# Patient Record
Sex: Female | Born: 1985 | Race: Black or African American | Hispanic: No | Marital: Married | Smoking: Never smoker
Health system: Southern US, Community
[De-identification: ages and names within clinical notes are randomized; demographics above are authoritative.]

## PROBLEM LIST (undated history)

## (undated) ENCOUNTER — Inpatient Hospital Stay (HOSPITAL_COMMUNITY): Payer: Self-pay

## (undated) DIAGNOSIS — I8390 Asymptomatic varicose veins of unspecified lower extremity: Secondary | ICD-10-CM

## (undated) DIAGNOSIS — Z789 Other specified health status: Secondary | ICD-10-CM

## (undated) DIAGNOSIS — I82409 Acute embolism and thrombosis of unspecified deep veins of unspecified lower extremity: Secondary | ICD-10-CM

## (undated) HISTORY — DX: Asymptomatic varicose veins of unspecified lower extremity: I83.90

## (undated) HISTORY — DX: Acute embolism and thrombosis of unspecified deep veins of unspecified lower extremity: I82.409

## (undated) HISTORY — PX: BREAST SURGERY: SHX581

## (undated) HISTORY — PX: OOPHORECTOMY: SHX86

---
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
## 2004-11-23 ENCOUNTER — Other Ambulatory Visit: Payer: Self-pay

## 2004-11-23 ENCOUNTER — Emergency Department: Payer: Self-pay | Admitting: Emergency Medicine

## 2005-11-24 ENCOUNTER — Ambulatory Visit: Payer: Self-pay | Admitting: Internal Medicine

## 2006-05-04 ENCOUNTER — Ambulatory Visit: Payer: Self-pay | Admitting: Family Medicine

## 2007-01-08 ENCOUNTER — Ambulatory Visit: Payer: Self-pay | Admitting: Family Medicine

## 2007-03-04 ENCOUNTER — Ambulatory Visit: Payer: Self-pay | Admitting: Family Medicine

## 2007-03-04 ENCOUNTER — Encounter (INDEPENDENT_AMBULATORY_CARE_PROVIDER_SITE_OTHER): Payer: Self-pay | Admitting: Internal Medicine

## 2007-03-04 LAB — CONVERTED CEMR LAB
Chlamydia, DNA Probe: NEGATIVE
GC Probe Amp, Genital: NEGATIVE

## 2007-04-28 ENCOUNTER — Encounter: Payer: Self-pay | Admitting: Family Medicine

## 2007-04-29 ENCOUNTER — Ambulatory Visit: Payer: Self-pay | Admitting: Family Medicine

## 2007-06-29 ENCOUNTER — Ambulatory Visit: Payer: Self-pay | Admitting: Family Medicine

## 2007-06-29 DIAGNOSIS — L709 Acne, unspecified: Secondary | ICD-10-CM | POA: Insufficient documentation

## 2007-06-29 DIAGNOSIS — L708 Other acne: Secondary | ICD-10-CM | POA: Insufficient documentation

## 2007-11-06 ENCOUNTER — Other Ambulatory Visit: Payer: Self-pay

## 2007-11-06 ENCOUNTER — Emergency Department: Payer: Self-pay | Admitting: Emergency Medicine

## 2007-11-28 ENCOUNTER — Emergency Department: Payer: Self-pay | Admitting: Emergency Medicine

## 2008-01-25 ENCOUNTER — Emergency Department: Payer: Self-pay | Admitting: Internal Medicine

## 2008-02-25 ENCOUNTER — Inpatient Hospital Stay (HOSPITAL_COMMUNITY): Admission: AD | Admit: 2008-02-25 | Discharge: 2008-02-25 | Payer: Self-pay | Admitting: Obstetrics and Gynecology

## 2008-03-19 ENCOUNTER — Inpatient Hospital Stay (HOSPITAL_COMMUNITY): Admission: AD | Admit: 2008-03-19 | Discharge: 2008-03-19 | Payer: Self-pay | Admitting: Obstetrics and Gynecology

## 2008-03-29 ENCOUNTER — Inpatient Hospital Stay (HOSPITAL_COMMUNITY): Admission: AD | Admit: 2008-03-29 | Discharge: 2008-03-29 | Payer: Self-pay | Admitting: Obstetrics and Gynecology

## 2008-04-05 ENCOUNTER — Inpatient Hospital Stay (HOSPITAL_COMMUNITY): Admission: AD | Admit: 2008-04-05 | Discharge: 2008-04-05 | Payer: Self-pay | Admitting: Obstetrics and Gynecology

## 2008-04-15 ENCOUNTER — Inpatient Hospital Stay (HOSPITAL_COMMUNITY): Admission: AD | Admit: 2008-04-15 | Discharge: 2008-04-15 | Payer: Self-pay | Admitting: Obstetrics and Gynecology

## 2008-04-24 IMAGING — US OB
1 series · 16 of 16 positions shown · non-contrast
Comparison: none

REASON FOR EXAM: VAG BLEEDING, RLQ PAIN
COMMENTS:

[Series 1: ob · 16 of 59 slices shown]
[im 1/59]
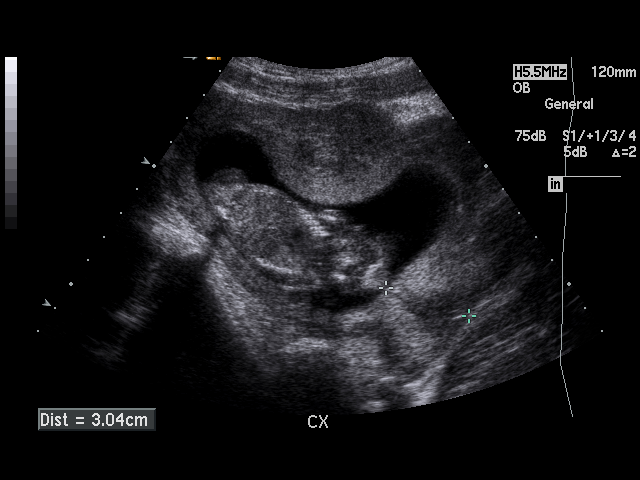
[im 4/59]
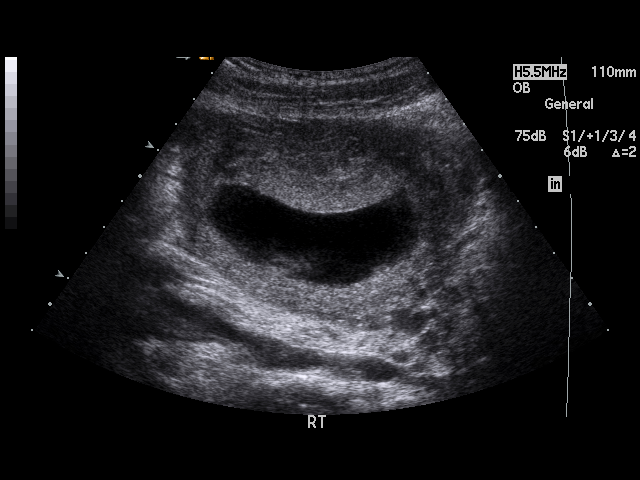
[im 8/59]
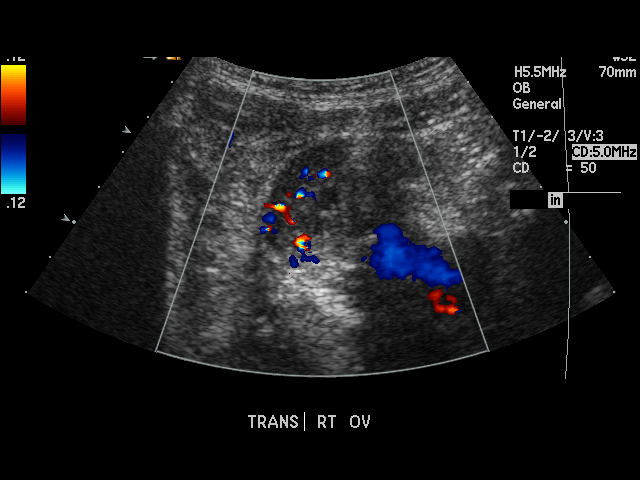
[im 12/59]
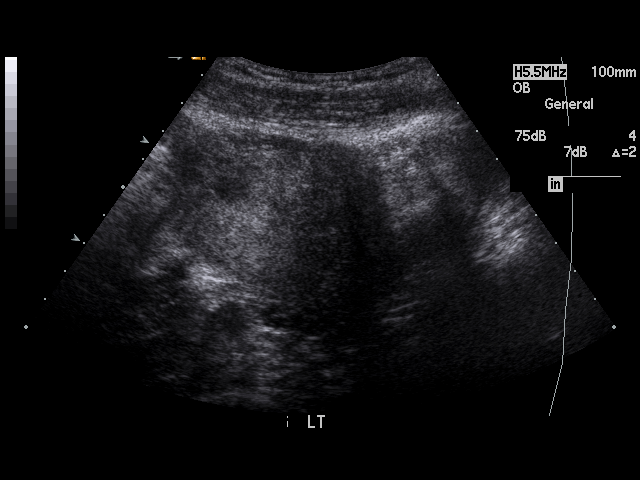
[im 16/59]
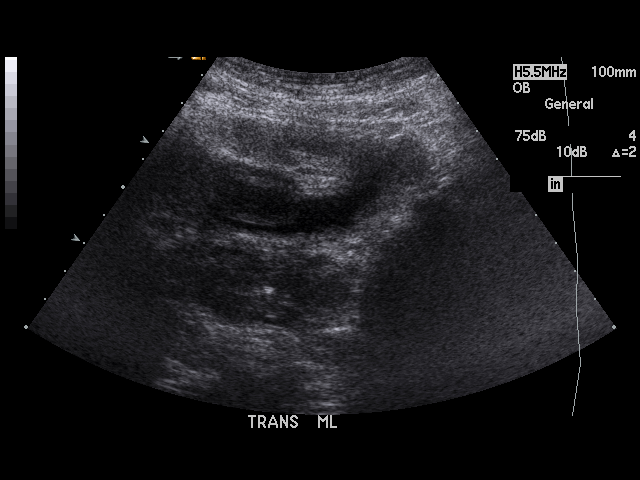
[im 20/59]
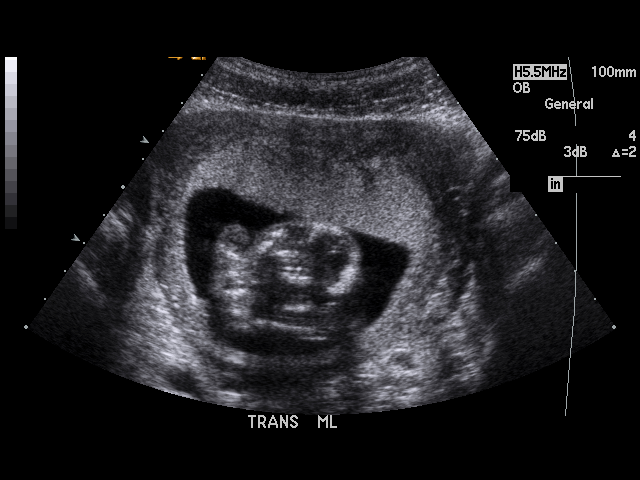
[im 24/59]
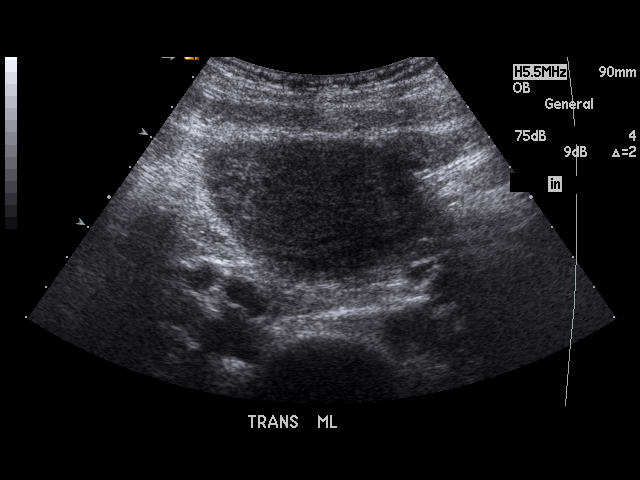
[im 28/59]
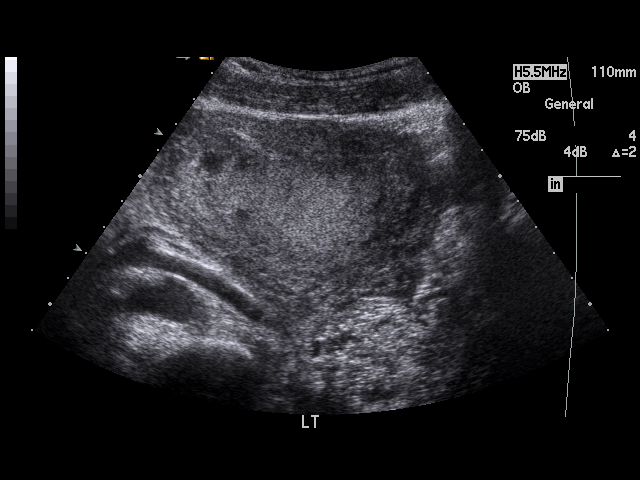
[im 31/59]
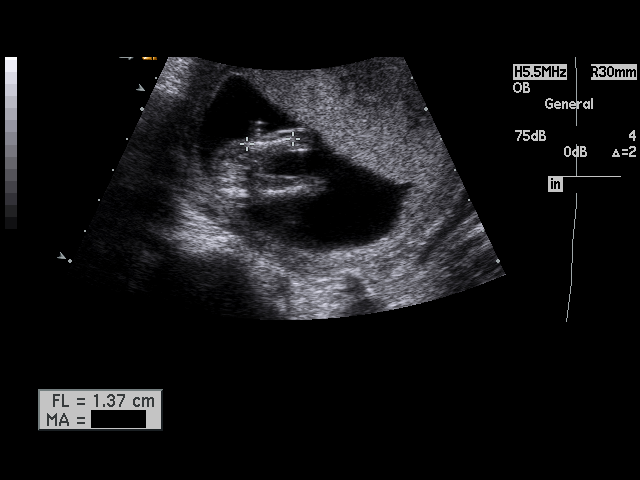
[im 35/59]
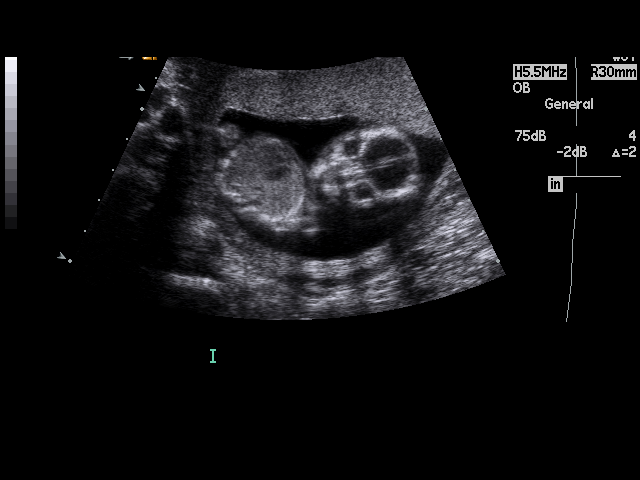
[im 39/59]
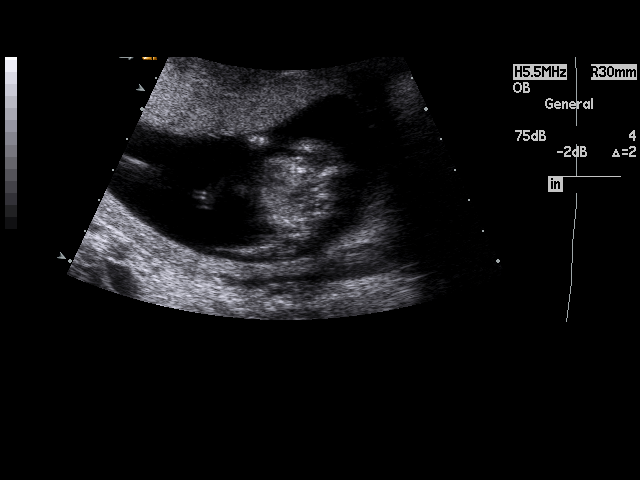
[im 43/59]
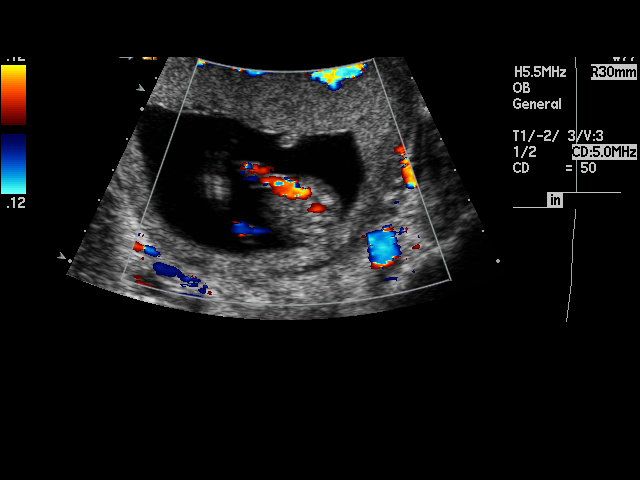
[im 47/59]
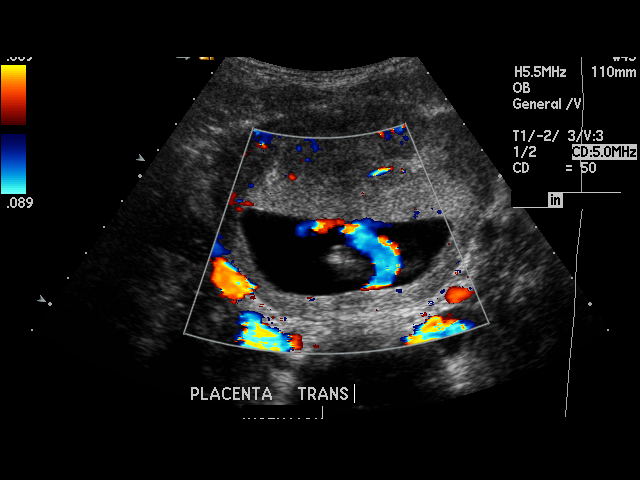
[im 51/59]
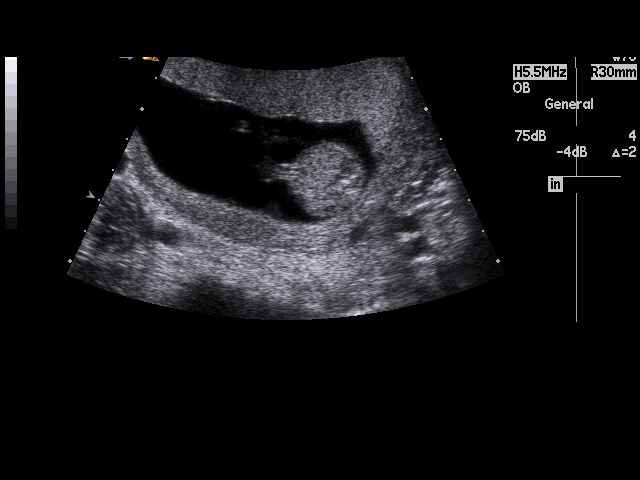
[im 55/59]
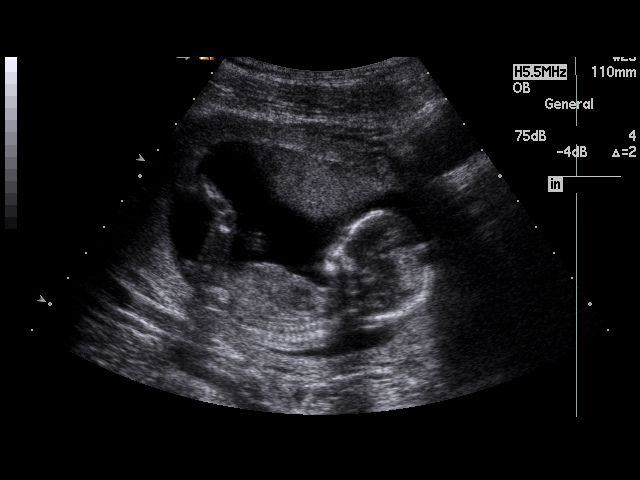
[im 59/59]
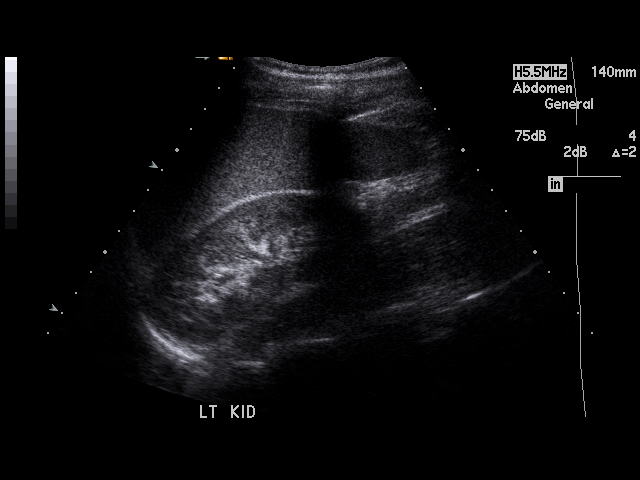

[16 of 16 positions shown; findings below may reference images not displayed]

PROCEDURE:     US  - US OB GREATER/OR EQUAL TO LONLS  - November 29, 2007  [DATE]

RESULT:     A single viable intrauterine pregnancy is appreciated with an
estimated gestational age of 14 weeks 5 days with an estimated date of
delivery 05/24/08.  Cardiac, trunk and extremity movement is appreciated.
Evaluation of the fetal anatomy demonstrates no stomach, diaphragm or
ventricular abnormalities.  Amniotic fluid is within normal limits.  The
placenta is Grade O, anterior and unremarkable.  Fetal heart rate is ranging
from 147 to 163 beats per minute.

Fetal biometry:

BPD   2.83 cm    Corresponding to an EGA of 15 weeks 0 days.
FL      1.47 cm     Corresponding to an EGA of 14 weeks 2 days

Estimated gestational age per LMP is 14 weeks 0 days.
IMPRESSION: Single viable intrauterine pregnancy.  The size appears to correlate with
estimated gestational age.

## 2008-05-17 ENCOUNTER — Inpatient Hospital Stay (HOSPITAL_COMMUNITY): Admission: AD | Admit: 2008-05-17 | Discharge: 2008-05-17 | Payer: Self-pay | Admitting: Obstetrics and Gynecology

## 2008-05-18 ENCOUNTER — Inpatient Hospital Stay (HOSPITAL_COMMUNITY): Admission: AD | Admit: 2008-05-18 | Discharge: 2008-05-20 | Payer: Self-pay | Admitting: Obstetrics and Gynecology

## 2008-05-25 ENCOUNTER — Inpatient Hospital Stay (HOSPITAL_COMMUNITY): Admission: AD | Admit: 2008-05-25 | Discharge: 2008-05-26 | Payer: Self-pay | Admitting: Obstetrics and Gynecology

## 2008-10-11 IMAGING — US US FETAL BPP W/O NONSTRESS
1 series · 9 of 9 positions shown · non-contrast
Comparison: none

OBSTETRICAL ULTRASOUND:
 This ultrasound exam was performed in the [HOSPITAL] Ultrasound Department.  The OB US report was generated in the AS system, and faxed to the ordering physician.  This report is also available in [REDACTED] PACS.

[Series 1: us fetal bpp w/o nonstress · non-contrast · 9 of 9 slices shown]
[im 1/9]
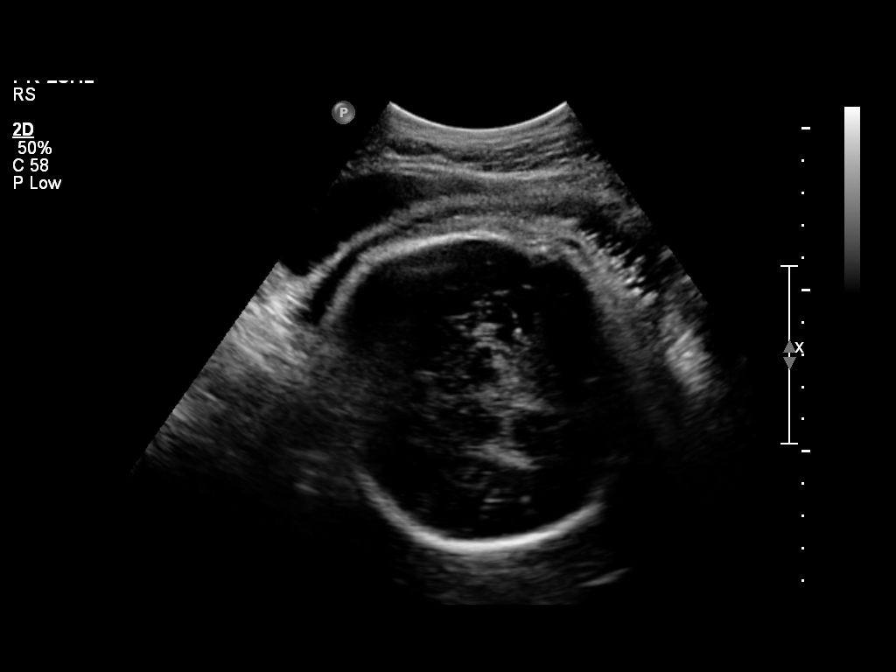
[im 2/9]
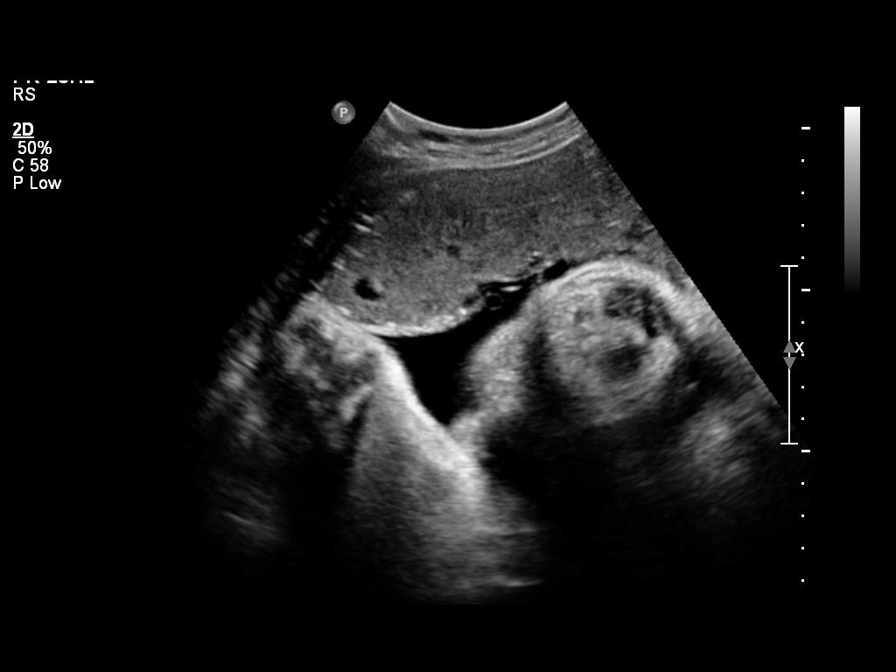
[im 3/9]
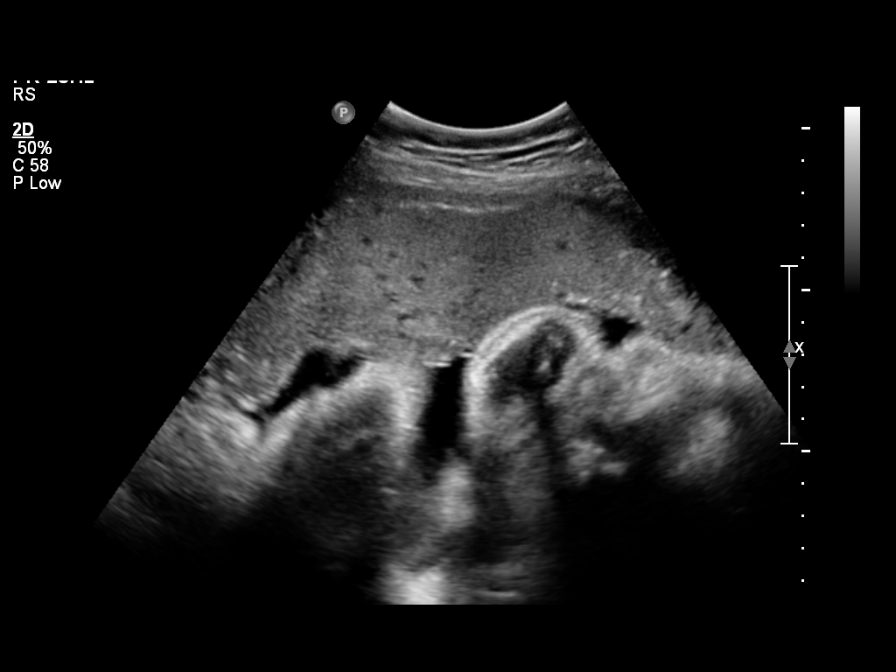
[im 4/9]
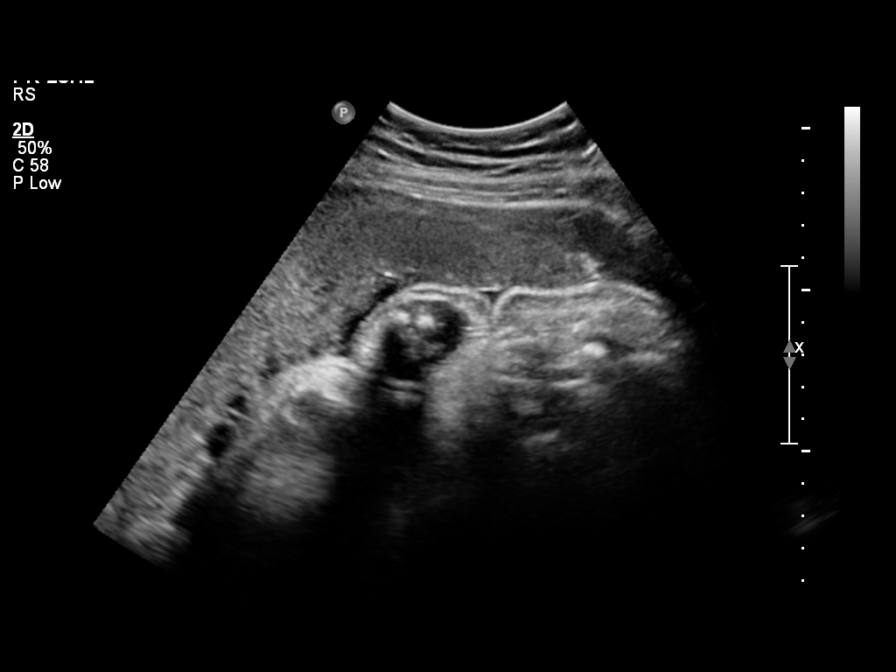
[im 5/9]
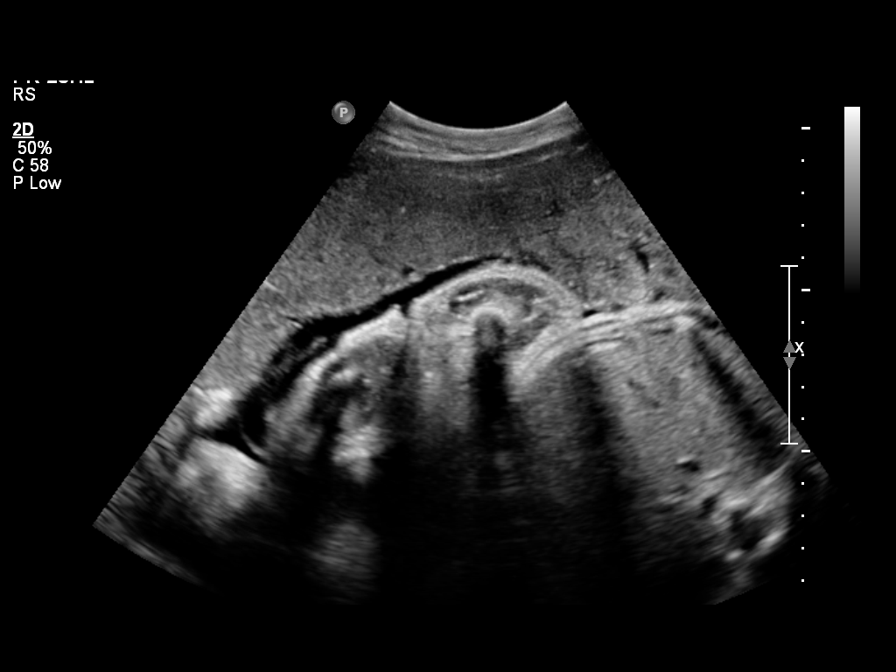
[im 6/9]
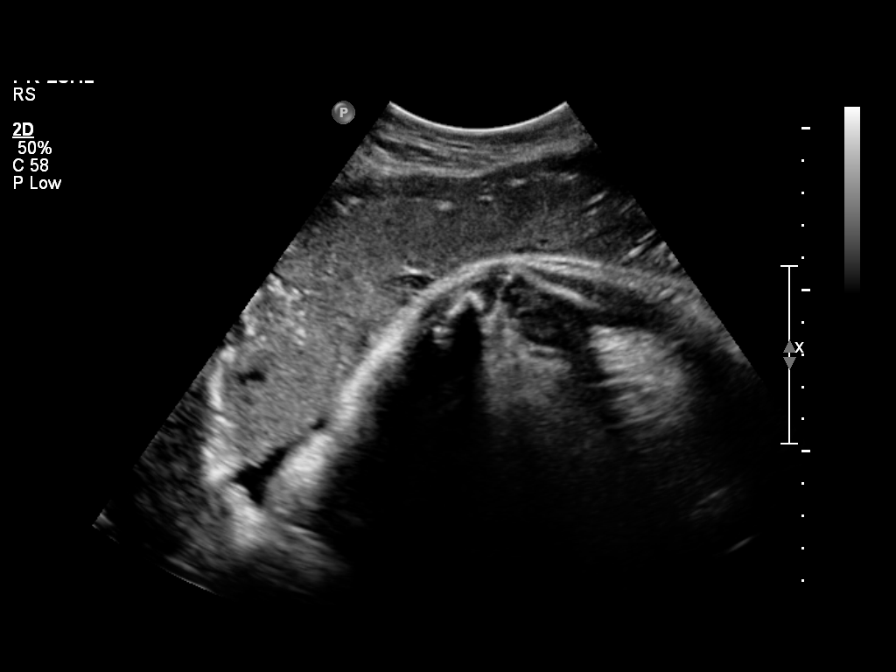
[im 7/9]
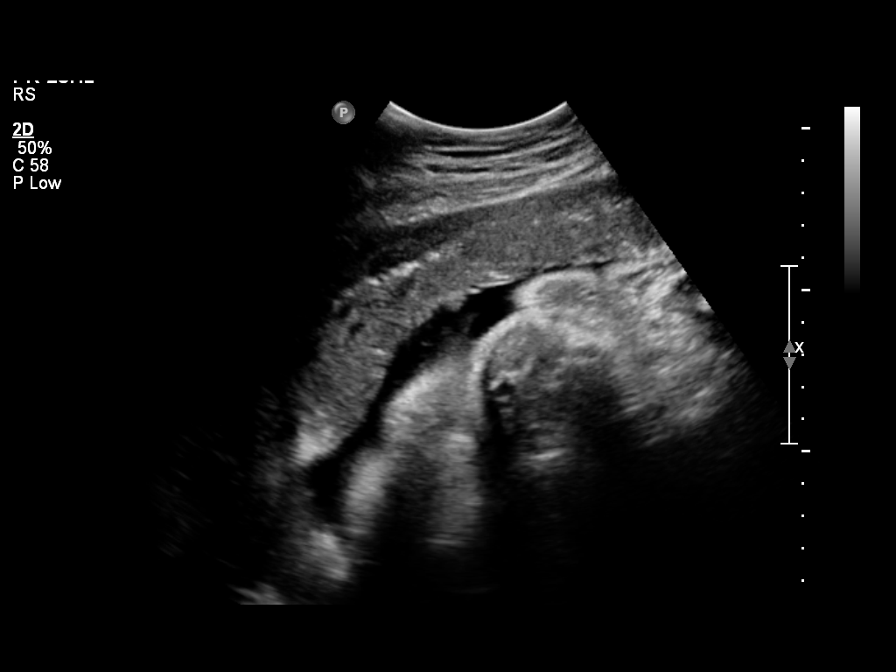
[im 8/9]
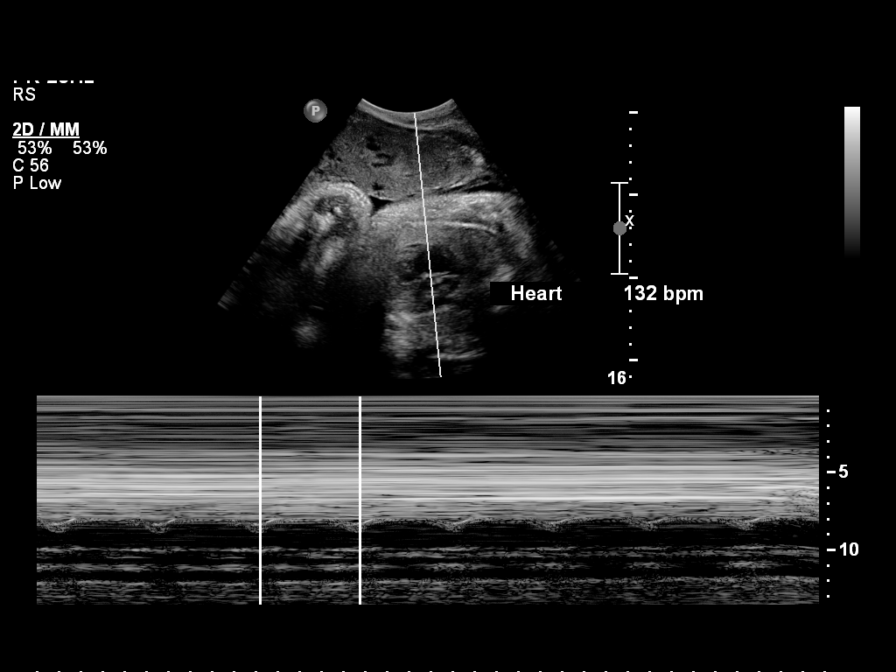
[im 9/9]
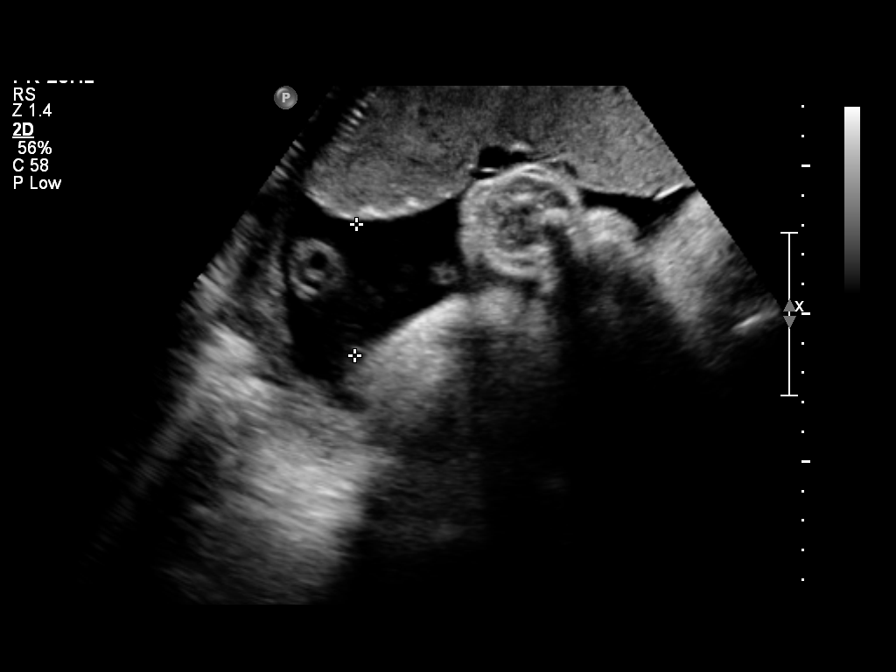

[9 of 9 positions shown; findings below may reference images not displayed]

IMPRESSION: See AS Obstetric US report.

## 2008-12-22 HISTORY — PX: OOPHORECTOMY: SHX86

## 2009-02-13 ENCOUNTER — Emergency Department (HOSPITAL_COMMUNITY): Admission: EM | Admit: 2009-02-13 | Discharge: 2009-02-13 | Payer: Self-pay | Admitting: Emergency Medicine

## 2009-04-03 ENCOUNTER — Inpatient Hospital Stay (HOSPITAL_COMMUNITY): Admission: AD | Admit: 2009-04-03 | Discharge: 2009-04-03 | Payer: Self-pay | Admitting: Family Medicine

## 2009-04-03 ENCOUNTER — Ambulatory Visit: Payer: Self-pay | Admitting: Family Medicine

## 2009-04-03 ENCOUNTER — Ambulatory Visit: Payer: Self-pay | Admitting: Cardiology

## 2009-04-03 ENCOUNTER — Ambulatory Visit: Payer: Self-pay | Admitting: Advanced Practice Midwife

## 2009-04-03 DIAGNOSIS — D369 Benign neoplasm, unspecified site: Secondary | ICD-10-CM | POA: Insufficient documentation

## 2009-04-03 LAB — CONVERTED CEMR LAB
AST: 17 units/L (ref 0–37)
Albumin: 3.8 g/dL (ref 3.5–5.2)
Alkaline Phosphatase: 57 units/L (ref 39–117)
Beta hcg, urine, semiquantitative: NEGATIVE
Blood in Urine, dipstick: NEGATIVE
CO2: 28 meq/L (ref 19–32)
Chloride: 109 meq/L (ref 96–112)
Eosinophils Relative: 0.2 % (ref 0.0–5.0)
Glucose, Bld: 90 mg/dL (ref 70–99)
Monocytes Relative: 5.4 % (ref 3.0–12.0)
Neutrophils Relative %: 83.9 % — ABNORMAL HIGH (ref 43.0–77.0)
Platelets: 216 10*3/uL (ref 150.0–400.0)
Potassium: 3.6 meq/L (ref 3.5–5.1)
Sodium: 141 meq/L (ref 135–145)
Specific Gravity, Urine: >=1.03
Total Protein: 7.4 g/dL (ref 6.0–8.3)
Urobilinogen, UA: 0.2
WBC: 5.6 10*3/uL (ref 4.5–10.5)

## 2009-04-04 ENCOUNTER — Telehealth: Payer: Self-pay | Admitting: Family Medicine

## 2009-04-10 ENCOUNTER — Encounter (INDEPENDENT_AMBULATORY_CARE_PROVIDER_SITE_OTHER): Payer: Self-pay | Admitting: Obstetrics and Gynecology

## 2009-04-11 ENCOUNTER — Inpatient Hospital Stay (HOSPITAL_COMMUNITY): Admission: RE | Admit: 2009-04-11 | Discharge: 2009-04-12 | Payer: Self-pay | Admitting: Obstetrics and Gynecology

## 2009-11-01 ENCOUNTER — Ambulatory Visit: Payer: Self-pay | Admitting: Family Medicine

## 2010-02-11 ENCOUNTER — Emergency Department: Payer: Self-pay | Admitting: Emergency Medicine

## 2010-02-13 ENCOUNTER — Telehealth: Payer: Self-pay | Admitting: Family Medicine

## 2010-03-20 ENCOUNTER — Ambulatory Visit: Payer: Self-pay | Admitting: Family Medicine

## 2010-03-20 DIAGNOSIS — R5383 Other fatigue: Secondary | ICD-10-CM

## 2010-03-20 DIAGNOSIS — R5381 Other malaise: Secondary | ICD-10-CM | POA: Insufficient documentation

## 2010-03-20 DIAGNOSIS — N979 Female infertility, unspecified: Secondary | ICD-10-CM | POA: Insufficient documentation

## 2010-03-21 ENCOUNTER — Encounter: Payer: Self-pay | Admitting: Family Medicine

## 2010-03-26 ENCOUNTER — Encounter: Payer: Self-pay | Admitting: Family Medicine

## 2010-03-26 LAB — CONVERTED CEMR LAB
AST: 19 units/L (ref 0–37)
BUN: 11 mg/dL (ref 6–23)
Basophils Absolute: 0 10*3/uL (ref 0.0–0.1)
Eosinophils Absolute: 0 10*3/uL (ref 0.0–0.7)
GFR calc non Af Amer: 98.58 mL/min (ref >60)
Glucose, Bld: 82 mg/dL (ref 70–99)
HCT: 39.2 % (ref 36.0–46.0)
Lymphs Abs: 2.3 10*3/uL (ref 0.7–4.0)
Monocytes Absolute: 0.3 10*3/uL (ref 0.1–1.0)
Monocytes Relative: 7.2 % (ref 3.0–12.0)
Platelets: 290 10*3/uL (ref 150.0–400.0)
Potassium: 4.2 meq/L (ref 3.5–5.1)
RDW: 17.7 % — ABNORMAL HIGH (ref 11.5–14.6)
TSH: 1.17 microintl units/mL (ref 0.35–5.50)
Total Bilirubin: 1.8 mg/dL — ABNORMAL HIGH (ref 0.3–1.2)
Vit D, 25-Hydroxy: 19 ng/mL — ABNORMAL LOW (ref 30–89)

## 2010-09-18 ENCOUNTER — Ambulatory Visit: Payer: Self-pay | Admitting: Internal Medicine

## 2010-09-18 DIAGNOSIS — J029 Acute pharyngitis, unspecified: Secondary | ICD-10-CM | POA: Insufficient documentation

## 2010-09-18 LAB — CONVERTED CEMR LAB: Rapid Strep: NEGATIVE

## 2011-01-21 NOTE — Assessment & Plan Note (Signed)
Summary: ??strep throat/alc   Vital Signs:  Patient profile:   25 year old female Weight:      135.25 pounds Temp:     97.8 degrees F oral Pulse rate:   78 / minute Pulse rhythm:   regular BP sitting:   116 / 82  (left arm) Cuff size:   regular  Vitals Entered By: Selena Batten Dance CMA Duncan Dull) (September 18, 2010 10:31 AM) CC: ST/laryngitis   History of Present Illness: CC: ST/lost voice  1 d h/o sore throat. This am with voice gone, hurts to talk and swallow.  No fevers/chills,  but did wake up diaphoretic last night.  No coughing.  No HA, sinus congestion or RN/sneezing.  Tried equate cold/flu gelcaps, not much improvement.  No abd pain, n/v/d.  No sick contacts at home.  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  + strep exposure  No asthma, allergies.  No smokers at home.  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   Eyes:  vision grossly intact, pupils equal, pupils round, and pupils reactive to light.  no conjunctival pallor, injection or icterus  Ears:  R ear normal and L ear normal.   Nose:  no nasal discharge.   Mouth:  erythematous pharynx, 1+ tonsils bilaterally, no exudates Neck:  + small AC LAD R>L, nontender  Lungs:  Normal respiratory effort, chest expands symmetrically. Lungs are  clear to auscultation, no crackles or wheezes. Heart:  Normal rate and regular rhythm. S1 and S2 normal without gallop, murmur, click, rub or other extra sounds. Pulses:  2+ rad pulses Extremities:  no edema Skin:  Intact without suspicious lesions or rashes   Impression & Recommendations:  Problem # 1:  SORE THROAT (ICD-462) rapid strep today given exposure.  negative.  viral URTI, discussed supportive treatment.  RTC if not improving as expected.   Orders: Rapid Strep (16109)  Patient Instructions: 1)  Rapid strep negative.  You have a viral infection.   2)  Wash hands to prevent spreading. 3)  Push fluids, plenty of rest, ibuprofen (motrin) for sore throat up to 600mg  every 6-8 hours.  Suck on cold things like popsicles, or try warm herbal teas for relief.  consider salt water gargles. 4)  If fever >101.5, trouble swallowing or breathing or opening mouth, drooling, or other concerns, you may need to return to be seen. 5)  Pleasure to see you today, call clinic with questions.   Prior Medications: Current Allergies (reviewed today): No known allergies   Laboratory Results  Date/Time Received: September 18, 2010 10:55 AM  Date/Time Reported: September 18, 2010 10:55 AM   Other Tests  Rapid Strep: negative

## 2011-01-21 NOTE — Assessment & Plan Note (Signed)
Summary: PERSONAL/CLE   Vital Signs:  Patient profile:   25 year old female Height:      66 inches Weight:      134.75 pounds BMI:     21.83 Temp:     98 degrees F oral Pulse rate:   60 / minute Pulse rhythm:   regular BP sitting:   108 / 70  (left arm) Cuff size:   regular  Vitals Entered By: Lewanda Rife LPN (March 20, 2010 4:21 PM) CC: Pt has been trying for approx 1 1/2 years to get pregnant and not pregnant yet, also pt feels tired and wants iron and Vit D level checked   History of Present Illness: has been trying to have a baby 11/2 years- has been trying for a while no birth control has tried basal body temp and ovulation kits - indicated she is ovulating every month    had a cyst on her R ovary and that was removed -- april of last year  no other gyn problems  last visit about a year ago with Dr Marcelle Overlie   is tired in general  some headaches this week  usual work and eating schedule  getting enough sleep -- 7-9 hours per night  work hours are not too bad   periods are very regular -- 28 days   not a lot of stress overall - except this infertility issue  is more cold natured than she used to be  some heavy menses- ? 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 Eyes:  Denies blurring and eye irritation. ENT:  Denies sore throat. CV:  Denies chest pain or discomfort and palpitations. Resp:  Denies cough, shortness of breath, and wheezing. GI:  Denies abdominal pain, bloody stools, change in bowel habits, indigestion, loss of appetite, and nausea. GU:  Denies abnormal vaginal bleeding, discharge, dysuria, hematuria, and urinary frequency. MS:  Denies joint pain and stiffness. Derm:  Denies poor wound healing and rash. Neuro:  Denies headaches, numbness, tingling, and weakness. Psych:  Denies anxiety and depression. Endo:  Denies cold intolerance, excessive thirst, excessive urination, heat intolerance, and weight change. Heme:  Denies abnormal bruising and bleeding.  Physical Exam  General:  Well-developed,well-nourished,in no acute distress; alert,appropriate and cooperative throughout examination Head:  normocephalic, atraumatic, and no abnormalities observed.   Eyes:  vision grossly intact, pupils equal, pupils round, and pupils reactive to light.  no conjunctival pallor, injection or icterus  Ears:  R ear normal and L ear normal.  Nose:  no nasal discharge.   Mouth:  pharynx pink and moist.   Neck:  supple with full rom and no masses or thyromegally, no JVD or carotid bruit  Chest Wall:  No deformities, masses, or tenderness noted. Lungs:  Normal respiratory effort, chest expands symmetrically. Lungs are clear to auscultation, no crackles or wheezes. Heart:  Normal rate and regular rhythm. S1 and S2 normal without gallop, murmur, click, rub or other extra sounds. Abdomen:  Bowel sounds positive,abdomen soft and non-tender without masses, organomegaly or hernias noted. no suprapubic tenderness or  fullness felt  Msk:  No deformity or scoliosis noted of thoracic or lumbar spine.  no acute joint changes  Pulses:  R and L carotid,radial,femoral,dorsalis pedis and posterior tibial pulses are full and equal bilaterally Extremities:  No clubbing, cyanosis, edema, or deformity noted with normal full range of motion of all joints.   Neurologic:  cranial nerves II-XII intact, sensation intact to light touch, gait normal, DTRs symmetrical and normal, and toes down bilaterally on Babinski.   Skin:  Intact without suspicious lesions or rashes no pallor or icterus  Cervical Nodes:  No lymphadenopathy noted Inguinal Nodes:  No significant adenopathy Psych:  normal affect, talkative and pleasant    Impression & Recommendations:  Problem # 1:  FATIGUE (ICD-780.79) Assessment New labs today incl vit D level at her request  some heavy menses also trouble getting pregnant labs today Orders: Venipuncture (16109) TLB-BMP (Basic Metabolic Panel-BMET) (80048-METABOL) TLB-CBC Platelet - w/Differential (85025-CBCD) TLB-Hepatic/Liver Function Pnl (80076-HEPATIC) TLB-TSH (Thyroid Stimulating Hormone) (84443-TSH) T-Vitamin D (25-Hydroxy) (60454-09811) Specimen Handling (91478)  Problem # 2:  FEMALE INFERTILITY (ICD-628.9) Assessment: New with no OC for 11/2 years- pt is getting worried  has had one ovary removed for cyst  per pt ovulation kits indicate she is regularly ovulating and menses are regular lab today incl thyroid ref to GYN for this and year ly exam  Orders: Venipuncture (29562) TLB-BMP (Basic Metabolic Panel-BMET) (80048-METABOL) TLB-CBC Platelet - w/Differential (85025-CBCD) TLB-Hepatic/Liver Function Pnl (80076-HEPATIC) TLB-TSH (Thyroid Stimulating Hormone) (13086-VHQ) Gynecologic Referral (Gyn)  Complete Medication List: 1)  Alka-seltzer Plus Cold 2-7.8-325 Mg Tbef (Chlorphen-phenyleph-asa) .... Otc as directed. 2)  Prenatal Vitamins 0.8 Mg Tabs (Prenatal multivit-min-fe-fa)  .Marland Kitchen.. 1 by mouth once daily  Patient Instructions: 1)  we will ref you to obgyn at check out  2)  labs today 3)  eat healthy diet  4)  start on your prenatal vitamins - I sent px to [facility]  Prescriptions: PRENATAL VITAMINS 0.8 MG TABS (PRENATAL MULTIVIT-MIN-FE-FA) 1 by mouth once daily  #90 x 3   Entered and Authorized by:   Judith Part MD   Signed by:   Judith Part MD on 03/20/2010   Method used:   Electronically to        [facility] [address] (retail)       [address], [address]       [place], [place]  [address]       Ph: [phone]       Fax: [phone]   RxID:   [id number]   Current Allergies (reviewed today): No known allergies

## 2011-01-21 NOTE — Miscellaneous (Signed)
Summary: vitamin D 50000iu rx  Medications Added VITAMIN D (ERGOCALCIFEROL) 50000 UNIT CAPS (ERGOCALCIFEROL) take one capsule by mouth every week for 10 weeks.       Clinical Lists Changes  Medications: Added new medication of VITAMIN D (ERGOCALCIFEROL) 50000 UNIT CAPS (ERGOCALCIFEROL) take one capsule by mouth every week for 10 weeks. - Signed Rx of VITAMIN D (ERGOCALCIFEROL) 50000 UNIT CAPS (ERGOCALCIFEROL) take one capsule by mouth every week for 10 weeks.;  #10 x 0;  Signed;  Entered by: Lewanda Rife LPN;  Authorized by: Judith Part MD;  Method used: Electronically to [facility] [address], [place], [place], [place]  [address], Ph: [phone], Fax: [phone]    Prescriptions: VITAMIN D (ERGOCALCIFEROL) 50000 UNIT CAPS (ERGOCALCIFEROL) take one capsule by mouth every week for 10 weeks.  #10 x 0   Entered by:   Lewanda Rife LPN   Authorized by:   Judith Part MD   Signed by:   Lewanda Rife LPN on 13/24/4010   Method used:   Electronically to        [facility] [address] (retail)       [address], [address]       [place], [place]  [address]       Ph: [phone]       Fax: [phone]   RxID:   [id number]   Current Allergies: No known allergies

## 2011-01-21 NOTE — Progress Notes (Signed)
Summary: Sick  Phone Note Call from Patient Call back at [facility] Phone [phone]   Caller: Patient Call For: Dr. Patsy Lager Summary of Call: Dr. Ermalene Searing patient: pt states she was vomiting and went to [facility] on Sunday, then the vomititng started back today, pt states [facility] gave her an IV and something for nausea in her IV. Pt would like to be seen, please advise.  *** we have no triage nurse today, all nurses will be talking calls. Initial call taken by: Mervin Hack CMA Duncan Dull),  February 13, 2010 8:13 AM  Follow-up for Phone Call        ok to put on my schedule Follow-up by: Hannah Beat MD,  February 13, 2010 8:17 AM  Additional Follow-up for Phone Call Additional follow up Details #1::        Spoke with patient and advised results, coming at 10 Additional Follow-up by: Mervin Hack CMA Duncan Dull),  February 13, 2010 8:24 AM

## 2011-01-21 NOTE — Progress Notes (Signed)
Summary: call a nurse   Phone Note Call from Patient Call back at Home Phone [phone]   Caller: Patient Call For: Kerby Nora MD Summary of Call: Triage Record Num: [id number] Operator: Aundra Millet Patient Name: Lauren Solis Call Date & Time: 02/12/2010 7:03:39PM Patient Phone: [phone] PCP: Idamae Schuller A. Tower Patient Gender: Female PCP Fax : Patient DOB: 12/16/1986 Practice Name: [facility] Reason for Call: LMP 01/22/2010- Onset of N/V with 5-10 episodes on 02/10/2010 and seen at [facility] ED. Was given nausea med and discharged. Also given rx for UTI, but hasnt strated yet - but wasnt having UTI sx's nor stlll is. No vomiting 02/11/2010, but returned 02/12/2010 with 3 vomting episodes since 1500 along with 2 diarrhea stools. Last vomiting episode at  ~ 1730 No fever. Has been drinking water and ginger ale and crackers at times. Worsening abd pain all day that isnt relieved with vomiting . Cant get comfortable. RN advised to go [facility] for evaluation -- Pt stated she will try to get transportation Protocol(s) Used: Abdominal Pain / Discomfort Protocol(s) Used: Nausea / Vomiting Recommended Outcome per Protocol: See ED Immediately Reason for Outcome: Abdominal pain is more bothersome than vomiting Abdominal pain that has steadily worsened over hours OR has been continuous for 3 hours or more AND any of the following: loss of appetite, vomiting starting after pain, any fever, OR unable to carry out normal activities Care Advice:  ~ Another adult should drive.  ~ Pain medication or laxatives should not be taken until symptoms are evaluated.  ~ Do not eat or drink anything until evaluated by provider.  ~ IMMEDIATE ACTION 02/12/2010 7:21:10PM Page 1 of 1 [id number] Initial call taken by: Melody Comas,  February 13, 2010 9:48 AM  Follow-up for Phone Call        appt today Follow-up by: Hannah Beat MD,  February 13, 2010 9:53 AM

## 2011-04-02 LAB — CBC
MCHC: 34.4 g/dL (ref 30.0–36.0)
Platelets: 235 10*3/uL (ref 150–400)
Platelets: 281 10*3/uL (ref 150–400)
RDW: 17 % — ABNORMAL HIGH (ref 11.5–15.5)
WBC: 3.5 10*3/uL — ABNORMAL LOW (ref 4.0–10.5)

## 2011-04-02 LAB — TYPE AND SCREEN
ABO/RH(D): A POS
Antibody Screen: NEGATIVE

## 2011-04-02 LAB — ABO/RH: ABO/RH(D): A POS

## 2011-04-02 LAB — PREGNANCY, URINE: Preg Test, Ur: NEGATIVE

## 2011-05-06 NOTE — Discharge Summary (Signed)
NAMEAMANDAJO, GONDER            ACCOUNT NO.:  [id number]   MEDICAL RECORD NO.:  [id number]          PATIENT TYPE:  INP   LOCATION:  9302                          FACILITY:  [facility]   PHYSICIAN:  Duke Salvia. Marcelle Overlie, M.D.DATE OF BIRTH:  1986-01-28   DATE OF ADMISSION:  04/10/2009  DATE OF DISCHARGE:  04/12/2009                               DISCHARGE SUMMARY   DISCHARGE DIAGNOSES:  1. Left dermoid cyst.  2. Pelvic pain.  3. Laparoscopy with laparotomy.  4. Left oophorectomy, this admission.  5. Right ovarian cystotomy.   SUMMARY OF HISTORY AND PHYSICAL EXAM:  Please see admission H and P for  details.  Briefly, a 25 year old with left lower quadrant pain.  CT  parameter showed to have a 6-cm dermoid cyst.  On April 10, 2009, under  general anesthesia, she underwent laparoscopy followed by mini  laparotomy with left oophorectomy.  The left ovary was completely  replaced by the dermoid cyst which was excised.  She had a right ovarian  cystotomy for a clear-walled cyst.  On the first postop day, hemoglobin  was 10.5, her diet was advanced, catheter was removed, she was ambulated  without difficulty.  On the second postop day, on April 12, 2009, she  was afebrile, incision was clean and dry, she was ready for discharge at  that point.   LABORATORY DATA:  Blood type is A positive.  Antibody screen negative.  UPT negative.  CBC on admission:  WBC 3.5, hemoglobin 12.9, hematocrit  38.2.  On April 11, 2009, CBC:  WBC 6.5, hemoglobin 10.5, hematocrit  30.6.   DISPOSITION:  The patient is discharged on Tylox p.r.n. pain.  Will  return to the office in 7-10 days.  Advised to report any incisional  redness or drainage, increased pain or bleeding, or fever over 101.  She  was given specific instructions regarding diet/exercise.   CONDITION:  Good.   ACTIVITY:  Good, increase as tolerated.      Richard M. Marcelle Overlie, M.D.  Electronically Signed     RM[facility]/MEDQ  D:  04/12/2009  T:   04/13/2009  Job:  [id number]

## 2011-05-06 NOTE — Op Note (Signed)
NAMEFRITZI, SCRIPTER            ACCOUNT NO.:  [id number]   MEDICAL RECORD NO.:  [id number]          PATIENT TYPE:  OIB   LOCATION:  9302                          [facility]ACILITY:  [facility]   PHYSICIAN:  Duke Salvia. Marcelle Overlie, M.D.DATE OF BIRTH:  [birth year]   DATE OF PROCEDURE:  DATE OF DISCHARGE:                               OPERATIVE REPORT   PREOPERATIVE DIAGNOSIS:  Pelvic pain, probable left ovarian dermoid  cyst.   POSTOPERATIVE DIAGNOSIS:  Pelvic pain, probable left ovarian dermoid  cyst.   PROCEDURE:  Diagnostic laparoscopy followed by minilaparotomy, left  oophorectomy, right ovarian cystotomy.   SURGEON:  Duke Salvia. Marcelle Overlie, MD   ANESTHESIA:  General endotracheal.   COMPLICATIONS:  None.   DRAINS:  In-and-out Foley catheter.   BLOOD LOSS:  Minimal.   SPECIMENS REMOVED:  Left ovary.   PROCEDURE AND FINDINGS:  The patient was taken to the operating room.  After an adequate level of general endotracheal anesthesia was obtained  with the patient's legs in stirrups, the abdomen, perineum, and vagina  prepped and draped in usual manner for laparoscopy.  Bladder was  drained, EUA carried out, uterus midposition, mobile adnexa, soft mass  on the left, difficult to delineate.  Hulka tenaculum was positioned.  Attention directed to the abdomen.  Subumbilical area was infiltrated  with 0.25% Marcaine plain.  A small incision was made.  The Veress  needle was introduced without difficulty.  Intra-abdominal position  verified by pressure and water testing.  A 2.5 L pneumoperitoneum was  then created.  Laparoscopic trocar and sleeve were then introduced  without difficulty.  Three fingerbreadths above the symphysis in the  midline a 5-mm trocar was inserted under direct visualization.  The  patient was then placed in Trendelenburg and pelvic findings as follows.   The left ovary was completely enlarged with a 6-cm smooth-walled cyst.  There was no ascites.  No excrescences were noted.   The ovary was  mobile.  There was a small centimeter and a half cyst at one pole of the  right ovary which was otherwise unremarkable.  I observed the ovary  carefully, I could not find any functional normal-appearing ovary that  remained on that side.  Due to concerns about spillage of the dermoid,  decision made to proceed with minilaparotomy.  The gas was allowed to  escape.  The umbilical incision was closed with a 4-0 Vicryl Rapide  suture subcuticular.  A small Pfannenstiel incision was made, carried  down to the fascia which was incised and extended transversely.  Rectus  muscle was divided in the midline.  Peritoneum was entered without  difficulty.  At this point, the right ovary could be teased up to the  incision since she was extremely thin.  On careful observation, again I  could not delineate any functional-appearing normal remaining ovary on  that side.  Decision made to proceed with left oophorectomy.  Two Kelly  clamps were then placed across the base of the ovary which was then  excised.  These were suture ligated in Heaney fashion with 0 Vicryl  suture.   Specimen was removed  to the side table and photographed, was incised and  had characteristics of a dermoid with oily material and hair, no teeth  noted.  This was sent to Pathology.  The operative site was hemostatic.  On the right ovary, the ovarian cystotomy was performed revealing clear  fluid only.  I did not demonstrate a residual teratoma on her right  side.  Prior to closure, sponge, needle, and instruments counts reported  as correct x2.  Peritoneum closed with a 2-0 chromic suture as were the  rectus muscles reapproximated with the same.  Fascia closed transversely  with 0 PDS suture.  The incision was infiltrated with Marcaine on the  way out.  A 4-0 Monocryl subcuticular suture was positioned, and the  Hulka tenaculum was removed.  She tolerated this well, went to recovery  room in good condition.       Richard M. Marcelle Overlie, M.D.  Electronically Signed     RM[facility]/MEDQ  D:  04/10/2009  T:  04/10/2009  Job:  [id number]

## 2011-06-02 ENCOUNTER — Emergency Department: Payer: Self-pay | Admitting: Unknown Physician Specialty

## 2011-09-15 LAB — COMPREHENSIVE METABOLIC PANEL
ALT: 17
AST: 17
Albumin: 2.8 — ABNORMAL LOW
CO2: 27
Calcium: 8.4
Chloride: 102
GFR calc Af Amer: >60
GFR calc non Af Amer: >60
Sodium: 135

## 2011-09-15 LAB — URINALYSIS, ROUTINE W REFLEX MICROSCOPIC
Nitrite: NEGATIVE
Protein, ur: NEGATIVE
Specific Gravity, Urine: 1.025
Urobilinogen, UA: 0.2

## 2011-09-15 LAB — CBC
MCHC: 34.3
Platelets: 228
RBC: 4.21
WBC: 9.8

## 2011-09-16 LAB — URINALYSIS, ROUTINE W REFLEX MICROSCOPIC
Bilirubin Urine: NEGATIVE
Glucose, UA: NEGATIVE
Hgb urine dipstick: NEGATIVE
Ketones, ur: NEGATIVE
Nitrite: NEGATIVE
Protein, ur: NEGATIVE
Protein, ur: NEGATIVE
Specific Gravity, Urine: 1.02
Urobilinogen, UA: 0.2
pH: 6.5

## 2011-09-16 LAB — URINE MICROSCOPIC-ADD ON

## 2011-09-16 LAB — FETAL FIBRONECTIN: Fetal Fibronectin: NEGATIVE

## 2011-09-17 LAB — CBC
Hemoglobin: 12.8
MCHC: 33.2
MCV: 68.8 — ABNORMAL LOW
Platelets: 190
Platelets: 205
RBC: 4.09
RDW: 20.5 — ABNORMAL HIGH
WBC: 21.9 — ABNORMAL HIGH

## 2011-09-18 LAB — URINE CULTURE
Colony Count: NO GROWTH
Culture: NO GROWTH

## 2011-09-18 LAB — CBC
Hemoglobin: 10.4 — ABNORMAL LOW
Platelets: 330
RDW: 20.8 — ABNORMAL HIGH
WBC: 6.5

## 2011-09-18 LAB — COMPREHENSIVE METABOLIC PANEL
AST: 18
Albumin: 2.9 — ABNORMAL LOW
Calcium: 9.3
Chloride: 107
Creatinine, Ser: 0.89
GFR calc Af Amer: >60
Total Bilirubin: 0.9
Total Protein: 6.7

## 2011-09-18 LAB — URINALYSIS, ROUTINE W REFLEX MICROSCOPIC
Glucose, UA: NEGATIVE
Ketones, ur: NEGATIVE
Nitrite: NEGATIVE
Protein, ur: NEGATIVE
pH: 6

## 2014-05-05 ENCOUNTER — Encounter: Payer: Self-pay | Admitting: Internal Medicine

## 2014-05-05 ENCOUNTER — Ambulatory Visit (INDEPENDENT_AMBULATORY_CARE_PROVIDER_SITE_OTHER): Payer: No Typology Code available for payment source | Admitting: Internal Medicine

## 2014-05-05 VITALS — BP 108/62 | HR 69 | Temp 98.2°F | Ht 66.0 in | Wt 136.0 lb

## 2014-05-05 DIAGNOSIS — B9689 Other specified bacterial agents as the cause of diseases classified elsewhere: Secondary | ICD-10-CM

## 2014-05-05 DIAGNOSIS — Z0000 Encounter for general adult medical examination without abnormal findings: Secondary | ICD-10-CM

## 2014-05-05 DIAGNOSIS — N760 Acute vaginitis: Secondary | ICD-10-CM

## 2014-05-05 DIAGNOSIS — A499 Bacterial infection, unspecified: Secondary | ICD-10-CM

## 2014-05-05 DIAGNOSIS — N898 Other specified noninflammatory disorders of vagina: Secondary | ICD-10-CM

## 2014-05-05 DIAGNOSIS — Z113 Encounter for screening for infections with a predominantly sexual mode of transmission: Secondary | ICD-10-CM

## 2014-05-05 LAB — COMPREHENSIVE METABOLIC PANEL
ALT: 16 U/L (ref 0–35)
AST: 18 U/L (ref 0–37)
Albumin: 4 g/dL (ref 3.5–5.2)
Alkaline Phosphatase: 50 U/L (ref 39–117)
BILIRUBIN TOTAL: 1.3 mg/dL — AB (ref 0.2–1.2)
BUN: 11 mg/dL (ref 6–23)
CO2: 28 meq/L (ref 19–32)
CREATININE: 1 mg/dL (ref 0.4–1.2)
Calcium: 9.4 mg/dL (ref 8.4–10.5)
Chloride: 103 mEq/L (ref 96–112)
GFR: 87.6 mL/min (ref >60.00)
GLUCOSE: 79 mg/dL (ref 70–99)
Potassium: 4.3 mEq/L (ref 3.5–5.1)
Sodium: 138 mEq/L (ref 135–145)
TOTAL PROTEIN: 7.4 g/dL (ref 6.0–8.3)

## 2014-05-05 LAB — LIPID PANEL
CHOLESTEROL: 166 mg/dL (ref 0–200)
HDL: 60.8 mg/dL (ref >39.00)
LDL Cholesterol: 98 mg/dL (ref 0–99)
Total CHOL/HDL Ratio: 3
Triglycerides: 34 mg/dL (ref 0.0–149.0)
VLDL: 6.8 mg/dL (ref 0.0–40.0)

## 2014-05-05 LAB — CBC
HEMATOCRIT: 39.9 % (ref 36.0–46.0)
HEMOGLOBIN: 13.6 g/dL (ref 12.0–15.0)
MCHC: 34 g/dL (ref 30.0–36.0)
MCV: 78.8 fl (ref 78.0–100.0)
PLATELETS: 262 10*3/uL (ref 150.0–400.0)
RBC: 5.06 Mil/uL (ref 3.87–5.11)
RDW: 14.3 % (ref 11.5–15.5)
WBC: 3.4 10*3/uL — AB (ref 4.0–10.5)

## 2014-05-05 LAB — TSH: TSH: 1.07 u[IU]/mL (ref 0.35–4.50)

## 2014-05-05 MED ORDER — METRONIDAZOLE 0.75 % VA GEL: 1.0000 | Freq: Two times a day (BID) | VAGINAL | Status: DC

## 2014-05-05 NOTE — Patient Instructions (Addendum)
Health Maintenance, Female A healthy lifestyle and preventative care can promote health and wellness.  Maintain regular health, dental, and eye exams.  Eat a healthy diet. Foods like vegetables, fruits, whole grains, low-fat dairy products, and lean protein foods contain the nutrients you need without too many calories. Decrease your intake of foods high in solid fats, added sugars, and salt. Get information about a proper diet from your caregiver, if necessary.  Regular physical exercise is one of the most important things you can do for your health. Most adults should get at least 150 minutes of moderate-intensity exercise (any activity that increases your heart rate and causes you to sweat) each week. In addition, most adults need muscle-strengthening exercises on 2 or more days a week.   Maintain a healthy weight. The body mass index (BMI) is a screening tool to identify possible weight problems. It provides an estimate of body fat based on height and weight. Your caregiver can help determine your BMI, and can help you achieve or maintain a healthy weight. For adults 20 years and older:  A BMI below 18.5 is considered underweight.  A BMI of 18.5 to 24.9 is normal.  A BMI of 25 to 29.9 is considered overweight.  A BMI of 30 and above is considered obese.  Maintain normal blood lipids and cholesterol by exercising and minimizing your intake of saturated fat. Eat a balanced diet with plenty of fruits and vegetables. Blood tests for lipids and cholesterol should begin at age 20 and be repeated every 5 years. If your lipid or cholesterol levels are high, you are over 50, or you are a high risk for heart disease, you may need your cholesterol levels checked more frequently.Ongoing high lipid and cholesterol levels should be treated with medicines if diet and exercise are not effective.  If you smoke, find out from your caregiver how to quit. If you do not use tobacco, do not start.  Lung  cancer screening is recommended for adults aged 55 80 years who are at high risk for developing lung cancer because of a history of smoking. Yearly low-dose computed tomography (CT) is recommended for people who have at least a 30-pack-year history of smoking and are a current smoker or have quit within the past 15 years. A pack year of smoking is smoking an average of 1 pack of cigarettes a day for 1 year (for example: 1 pack a day for 30 years or 2 packs a day for 15 years). Yearly screening should continue until the smoker has stopped smoking for at least 15 years. Yearly screening should also be stopped for people who develop a health problem that would prevent them from having lung cancer treatment.  If you are pregnant, do not drink alcohol. If you are breastfeeding, be very cautious about drinking alcohol. If you are not pregnant and choose to drink alcohol, do not exceed 1 drink per day. One drink is considered to be 12 ounces (355 mL) of beer, 5 ounces (148 mL) of wine, or 1.5 ounces (44 mL) of liquor.  Avoid use of street drugs. Do not share needles with anyone. Ask for help if you need support or instructions about stopping the use of drugs.  High blood pressure causes heart disease and increases the risk of stroke. Blood pressure should be checked at least every 1 to 2 years. Ongoing high blood pressure should be treated with medicines, if weight loss and exercise are not effective.  If you are 55 to   29 years old, ask your caregiver if you should take aspirin to prevent strokes.  Diabetes screening involves taking a blood sample to check your fasting blood sugar level. This should be done once every 3 years, after age 45, if you are within normal weight and without risk factors for diabetes. Testing should be considered at a younger age or be carried out more frequently if you are overweight and have at least 1 risk factor for diabetes.  Breast cancer screening is essential preventative care  for women. You should practice "breast self-awareness." This means understanding the normal appearance and feel of your breasts and may include breast self-examination. Any changes detected, no matter how small, should be reported to a caregiver. Women in their 20s and 30s should have a clinical breast exam (CBE) by a caregiver as part of a regular health exam every 1 to 3 years. After age 40, women should have a CBE every year. Starting at age 40, women should consider having a mammogram (breast X-ray) every year. Women who have a family history of breast cancer should talk to their caregiver about genetic screening. Women at a high risk of breast cancer should talk to their caregiver about having an MRI and a mammogram every year.  Breast cancer gene (BRCA)-related cancer risk assessment is recommended for women who have family members with BRCA-related cancers. BRCA-related cancers include breast, ovarian, tubal, and peritoneal cancers. Having family members with these cancers may be associated with an increased risk for harmful changes (mutations) in the breast cancer genes BRCA1 and BRCA2. Results of the assessment will determine the need for genetic counseling and BRCA1 and BRCA2 testing.  The Pap test is a screening test for cervical cancer. Women should have a Pap test starting at age 21. Between ages 21 and 29, Pap tests should be repeated every 2 years. Beginning at age 30, you should have a Pap test every 3 years as long as the past 3 Pap tests have been normal. If you had a hysterectomy for a problem that was not cancer or a condition that could lead to cancer, then you no longer need Pap tests. If you are between ages 65 and 70, and you have had normal Pap tests going back 10 years, you no longer need Pap tests. If you have had past treatment for cervical cancer or a condition that could lead to cancer, you need Pap tests and screening for cancer for at least 20 years after your treatment. If Pap  tests have been discontinued, risk factors (such as a new sexual partner) need to be reassessed to determine if screening should be resumed. Some women have medical problems that increase the chance of getting cervical cancer. In these cases, your caregiver may recommend more frequent screening and Pap tests.  The human papillomavirus (HPV) test is an additional test that may be used for cervical cancer screening. The HPV test looks for the virus that can cause the cell changes on the cervix. The cells collected during the Pap test can be tested for HPV. The HPV test could be used to screen women aged 30 years and older, and should be used in women of any age who have unclear Pap test results. After the age of 30, women should have HPV testing at the same frequency as a Pap test.  Colorectal cancer can be detected and often prevented. Most routine colorectal cancer screening begins at the age of 50 and continues through age 75. However, your caregiver   may recommend screening at an earlier age if you have risk factors for colon cancer. On a yearly basis, your caregiver may provide home test kits to check for hidden blood in the stool. Use of a small camera at the end of a tube, to directly examine the colon (sigmoidoscopy or colonoscopy), can detect the earliest forms of colorectal cancer. Talk to your caregiver about this at age 37, when routine screening begins. Direct examination of the colon should be repeated every 5 to 10 years through age 63, unless early forms of pre-cancerous polyps or small growths are found.  Hepatitis C blood testing is recommended for all people born from [birth year] through 1966 and any individual with known risks for hepatitis C.  Practice safe sex. Use condoms and avoid high-risk sexual practices to reduce the spread of sexually transmitted infections (STIs). Sexually active women aged 103 and younger should be checked for Chlamydia, which is a common sexually transmitted infection.  Older women with new or multiple partners should also be tested for Chlamydia. Testing for other STIs is recommended if you are sexually active and at increased risk.  Osteoporosis is a disease in which the bones lose minerals and strength with aging. This can result in serious bone fractures. The risk of osteoporosis can be identified using a bone density scan. Women ages 38 and over and women at risk for fractures or osteoporosis should discuss screening with their caregivers. Ask your caregiver whether you should be taking a calcium supplement or vitamin D to reduce the rate of osteoporosis.  Menopause can be associated with physical symptoms and risks. Hormone replacement therapy is available to decrease symptoms and risks. You should talk to your caregiver about whether hormone replacement therapy is right for you.  Use sunscreen. Apply sunscreen liberally and repeatedly throughout the day. You should seek shade when your shadow is shorter than you. Protect yourself by wearing long sleeves, pants, a wide-brimmed hat, and sunglasses year round, whenever you are outdoors.  Notify your caregiver of new moles or changes in moles, especially if there is a change in shape or color. Also notify your caregiver if a mole is larger than the size of a pencil eraser.  Stay current with your immunizations. Document Released: 06/23/2011 Document Revised: 04/04/2013 Document Reviewed: 06/23/2011 [facility] Patient Information 2014 [place]. Bacterial Vaginosis Bacterial vaginosis is a vaginal infection that occurs when the normal balance of bacteria in the vagina is disrupted. It results from an overgrowth of certain bacteria. This is the most common vaginal infection in women of childbearing age. Treatment is important to prevent complications, especially in pregnant women, as it can cause a premature delivery. CAUSES  Bacterial vaginosis is caused by an increase in harmful bacteria that are normally  present in smaller amounts in the vagina. Several different kinds of bacteria can cause bacterial vaginosis. However, the reason that the condition develops is not fully understood. RISK FACTORS Certain activities or behaviors can put you at an increased risk of developing bacterial vaginosis, including:  Having a new sex partner or multiple sex partners.  Douching.  Using an intrauterine device (IUD) for contraception. Women do not get bacterial vaginosis from toilet seats, bedding, swimming pools, or contact with objects around them. SIGNS AND SYMPTOMS  Some women with bacterial vaginosis have no signs or symptoms. Common symptoms include:  Grey vaginal discharge.  A fishlike odor with discharge, especially after sexual intercourse.  Itching or burning of the vagina and vulva.  Burning or pain  with urination. DIAGNOSIS  Your health care provider will take a medical history and examine the vagina for signs of bacterial vaginosis. A sample of vaginal fluid may be taken. Your health care provider will look at this sample under a microscope to check for bacteria and abnormal cells. A vaginal pH test may also be done.  TREATMENT  Bacterial vaginosis may be treated with antibiotic medicines. These may be given in the form of a pill or a vaginal cream. A second round of antibiotics may be prescribed if the condition comes back after treatment.  HOME CARE INSTRUCTIONS   Only take over-the-counter or prescription medicines as directed by your health care provider.  If antibiotic medicine was prescribed, take it as directed. Make sure you finish it even if you start to feel better.  Do not have sex until treatment is completed.  Tell all sexual partners that you have a vaginal infection. They should see their health care provider and be treated if they have problems, such as a mild rash or itching.  Practice safe sex by using condoms and only having one sex partner. SEEK MEDICAL CARE IF:     Your symptoms are not improving after 3 days of treatment.  You have increased discharge or pain.  You have a fever. MAKE SURE YOU:   Understand these instructions.  Will watch your condition.  Will get help right away if you are not doing well or get worse. FOR MORE INFORMATION  Centers for Disease Control and Prevention, Division of STD Prevention: [employer]raiser[employer]raud[employer] American Sexual Health Association (ASHA): www.ashastd.org  Document Released: 12/08/2005 Document Revised: 09/28/2013 Document Reviewed: 07/20/2013 [facility] Patient Information 2014 [place].

## 2014-05-05 NOTE — Progress Notes (Signed)
Pre visit review using our clinic review tool, if applicable. No additional management support is needed unless otherwise documented below in the visit note. 

## 2014-05-05 NOTE — Progress Notes (Signed)
HPI  Pt presents to the clinic today to establish care. She has not had a PCP in many years. She does have some concerns today about vaginal discharge. She does have a thick white discharge, not itching or burning. [facility] and they gave her diflucan. She reports that they did not do a vaginal exam.  It did not help with her discharge. She reports she does use summers eve vaginal wash. She does not douch. Additionally, she reports she is getting married in august and would like a full STD workup.  Flu: never Tetanus: unsure Pap Smear: 2014 Dentist: biannually  History reviewed. No pertinent past medical history.  Current Outpatient Prescriptions  Medication Sig Dispense Refill  . fluconazole (DIFLUCAN) 200 MG tablet Take 200 mg by mouth daily.      . Multiple Vitamins-Minerals (HAIR VITAMINS PO) Take 2 capsules by mouth daily.       No current facility-administered medications for this visit.    No Known Allergies  Family History  Problem Relation Age of Onset  . Cancer Maternal Aunt     Breast  . Diabetes Maternal Aunt   . Diabetes Maternal Uncle   . Kidney disease Maternal Grandmother     History   Social History  . Marital Status: Single    Spouse Name: N/A    Number of Children: N/A  . Years of Education: N/A   Occupational History  . Not on file.   Social History Main Topics  . Smoking status: Never Smoker   . Smokeless tobacco: Not on file  . Alcohol Use: No  . Drug Use: No  . Sexual Activity: Yes   Other Topics Concern  . Not on file   Social History Narrative  . No narrative on file    ROS:  Constitutional: Denies fever, malaise, fatigue, headache or abrupt weight changes.  HEENT: Denies eye pain, eye redness, ear pain, ringing in the ears, wax buildup, runny nose, nasal congestion, bloody nose, or sore throat. Respiratory: Denies difficulty breathing, shortness of breath, cough or sputum production.   Cardiovascular: Denies  chest pain, chest tightness, palpitations or swelling in the hands or feet.  Gastrointestinal: Denies abdominal pain, bloating, constipation, diarrhea or blood in the stool.  GU: Pt reports vaginal discharge. Denies frequency, urgency, pain with urination, blood in urine, odor. Musculoskeletal: Denies decrease in range of motion, difficulty with gait, muscle pain or joint pain and swelling.  Skin: Denies redness, rashes, lesions or ulcercations.  Neurological: Denies dizziness, difficulty with memory, difficulty with speech or problems with balance and coordination.   No other specific complaints in a complete review of systems (except as listed in HPI above).  PE:  BP 108/62  Pulse 69  Temp(Src) 98.2 F (36.8 C) (Oral)  Ht 5\' 6"  (1.676 m)  Wt 136 lb (61.689 kg)  BMI 21.96 kg/m2  SpO2 98%  LMP 04/14/2014 Wt Readings from Last 3 Encounters:  05/05/14 136 lb (61.689 kg)  09/18/10 135 lb 4 oz (61.349 kg)  03/20/10 134 lb 12 oz (61.122 kg)    General: Appears her stated age, well developed, well nourished in NAD. HEENT: Head: normal shape and size; Eyes: sclera white, no icterus, conjunctiva pink, PERRLA and EOMs intact; Ears: Tm's gray and intact, normal light reflex; Nose: mucosa pink and moist, septum midline; Throat/Mouth: Teeth present, mucosa pink and moist, no lesions or ulcerations noted.  Neck: Normal range of motion. Neck supple, trachea midline. No massses, lumps  or thyromegaly present.  Cardiovascular: Normal rate and rhythm. S1,S2 noted.  No murmur, rubs or gallops noted. No JVD or BLE edema. No carotid bruits noted. Pulmonary/Chest: Normal effort and positive vesicular breath sounds. No respiratory distress. No wheezes, rales or ronchi noted.  Abdomen: Soft and nontender. Normal bowel sounds, no bruits noted. No distention or masses noted. Liver, spleen and kidneys non palpable. Pelvic: Normal female anatomy. Moderate amount of pale yellow discharge noted at vaginal opening  and cervical os. No CMT noted. Musculoskeletal: Normal range of motion. No signs of joint swelling. No difficulty with gait.  Neurological: Alert and oriented. Cranial nerves II-XII intact. Coordination normal. +DTRs bilaterally. Psychiatric: Mood and affect normal. Behavior is normal. Judgment and thought content normal.    BMET    Component Value Date/Time   NA 141 03/20/2010 1652   K 4.2 03/20/2010 1652   CL 105 03/20/2010 1652   CO2 28 03/20/2010 1652   GLUCOSE 82 03/20/2010 1652   BUN 11 03/20/2010 1652   CREATININE 0.9 03/20/2010 1652   CALCIUM 9.7 03/20/2010 1652   GFRNONAA 98.58 03/20/2010 1652   GFRAA  Value: >60        The eGFR has been calculated using the MDRD equation. This calculation has not been validated in all clinical 05/25/2008 2227    CBC    Component Value Date/Time   WBC 4.2* 03/20/2010 1652   RBC 5.07 03/20/2010 1652   HGB 13.2 03/20/2010 1652   HCT 39.2 03/20/2010 1652   PLT 290.0 03/20/2010 1652   MCV 77.4* 03/20/2010 1652   MCHC 33.6 03/20/2010 1652   RDW 17.7* 03/20/2010 1652   LYMPHSABS 2.3 03/20/2010 1652   MONOABS 0.3 03/20/2010 1652   EOSABS 0.0 03/20/2010 1652   BASOSABS 0.0 03/20/2010 1652       Assessment and Plan:  Preventative Health Care:  Will check basic screening labs today Will also check GC probe, HIV and RPR  Bacterial Vaginosis:  Wet prep: + clue cells, + whiff, - yeast, - trich eRx for Metrogel Encouraged her to stop using summers eve, try dove sensitive skin instead  RTC in 1 year or sooner if needed

## 2014-05-06 LAB — GC/CHLAMYDIA PROBE AMP
CT Probe RNA: NEGATIVE
GC PROBE AMP APTIMA: NEGATIVE

## 2014-05-06 LAB — RPR

## 2014-05-06 LAB — HIV ANTIBODY (ROUTINE TESTING W REFLEX): HIV: NONREACTIVE

## 2014-05-12 ENCOUNTER — Other Ambulatory Visit: Payer: Self-pay

## 2014-05-12 DIAGNOSIS — B9689 Other specified bacterial agents as the cause of diseases classified elsewhere: Secondary | ICD-10-CM

## 2014-05-12 DIAGNOSIS — N898 Other specified noninflammatory disorders of vagina: Secondary | ICD-10-CM

## 2014-05-12 DIAGNOSIS — N760 Acute vaginitis: Secondary | ICD-10-CM

## 2014-05-12 NOTE — Telephone Encounter (Signed)
Pt left v/m; pt was seen on 05/05/14 and pt has finished metrogel; pt saw improvement but since finished med symptoms are returning. [facility] [address]. Unable to reach pt to see what symptoms pt is referring to. 05/05/14 note had thick white vaginal discharge.

## 2014-05-16 MED ORDER — METRONIDAZOLE 0.75 % VA GEL: 1.0000 | Freq: Two times a day (BID) | VAGINAL | Status: DC

## 2014-05-16 NOTE — Telephone Encounter (Signed)
Pt is aware.  

## 2014-08-25 LAB — OB RESULTS CONSOLE ANTIBODY SCREEN: ANTIBODY SCREEN: NEGATIVE

## 2014-08-25 LAB — OB RESULTS CONSOLE ABO/RH: RH Type: POSITIVE

## 2014-08-25 LAB — OB RESULTS CONSOLE RPR: RPR: NONREACTIVE

## 2014-08-25 LAB — OB RESULTS CONSOLE HEPATITIS B SURFACE ANTIGEN: HEP B S AG: NEGATIVE

## 2014-08-25 LAB — OB RESULTS CONSOLE RUBELLA ANTIBODY, IGM: RUBELLA: IMMUNE

## 2014-08-25 LAB — OB RESULTS CONSOLE HIV ANTIBODY (ROUTINE TESTING): HIV: NONREACTIVE

## 2014-09-12 DIAGNOSIS — D582 Other hemoglobinopathies: Secondary | ICD-10-CM | POA: Insufficient documentation

## 2014-10-04 DIAGNOSIS — R87619 Unspecified abnormal cytological findings in specimens from cervix uteri: Secondary | ICD-10-CM | POA: Insufficient documentation

## 2014-11-07 ENCOUNTER — Emergency Department (HOSPITAL_COMMUNITY)
Admission: EM | Admit: 2014-11-07 | Discharge: 2014-11-07 | Disposition: A | Discharge status: Home / Self Care | Payer: Medicaid Other | Attending: Emergency Medicine | Admitting: Emergency Medicine

## 2014-11-07 ENCOUNTER — Emergency Department (HOSPITAL_COMMUNITY): Payer: Medicaid Other

## 2014-11-07 ENCOUNTER — Encounter (HOSPITAL_COMMUNITY): Payer: Self-pay

## 2014-11-07 DIAGNOSIS — O208 Other hemorrhage in early pregnancy: Secondary | ICD-10-CM | POA: Diagnosis not present

## 2014-11-07 DIAGNOSIS — Z3A25 25 weeks gestation of pregnancy: Secondary | ICD-10-CM | POA: Insufficient documentation

## 2014-11-07 DIAGNOSIS — O26899 Other specified pregnancy related conditions, unspecified trimester: Secondary | ICD-10-CM

## 2014-11-07 DIAGNOSIS — R109 Unspecified abdominal pain: Secondary | ICD-10-CM

## 2014-11-07 DIAGNOSIS — Z90722 Acquired absence of ovaries, bilateral: Secondary | ICD-10-CM | POA: Insufficient documentation

## 2014-11-07 DIAGNOSIS — O4692 Antepartum hemorrhage, unspecified, second trimester: Secondary | ICD-10-CM

## 2014-11-07 DIAGNOSIS — Z79899 Other long term (current) drug therapy: Secondary | ICD-10-CM | POA: Diagnosis not present

## 2014-11-07 HISTORY — DX: Other specified health status: Z78.9

## 2014-11-07 LAB — I-STAT CHEM 8, ED
BUN: 5 mg/dL — AB (ref 6–23)
CHLORIDE: 104 meq/L (ref 96–112)
Calcium, Ion: 1.18 mmol/L (ref 1.12–1.23)
Creatinine, Ser: 0.6 mg/dL (ref 0.50–1.10)
Glucose, Bld: 87 mg/dL (ref 70–99)
HCT: 36 % (ref 36.0–46.0)
Hemoglobin: 12.2 g/dL (ref 12.0–15.0)
Potassium: 3.4 mEq/L — ABNORMAL LOW (ref 3.7–5.3)
SODIUM: 137 meq/L (ref 137–147)
TCO2: 20 mmol/L (ref 0–100)

## 2014-11-07 LAB — CBC WITH DIFFERENTIAL/PLATELET
BASOS ABS: 0.1 10*3/uL (ref 0.0–0.1)
Basophils Relative: 1 % (ref 0–1)
Eosinophils Absolute: 0.1 10*3/uL (ref 0.0–0.7)
Eosinophils Relative: 2 % (ref 0–5)
HEMATOCRIT: 32.7 % — AB (ref 36.0–46.0)
Hemoglobin: 11.7 g/dL — ABNORMAL LOW (ref 12.0–15.0)
LYMPHS PCT: 24 % (ref 12–46)
Lymphs Abs: 1.5 10*3/uL (ref 0.7–4.0)
MCH: 27.3 pg (ref 26.0–34.0)
MCHC: 35.8 g/dL (ref 30.0–36.0)
MCV: 76.2 fL — ABNORMAL LOW (ref 78.0–100.0)
MONO ABS: 0.6 10*3/uL (ref 0.1–1.0)
Monocytes Relative: 9 % (ref 3–12)
NEUTROS ABS: 3.9 10*3/uL (ref 1.7–7.7)
Neutrophils Relative %: 64 % (ref 43–77)
PLATELETS: 191 10*3/uL (ref 150–400)
RBC: 4.29 MIL/uL (ref 3.87–5.11)
RDW: 14 % (ref 11.5–15.5)
WBC: 6.1 10*3/uL (ref 4.0–10.5)

## 2014-11-07 LAB — WET PREP, GENITAL
Clue Cells Wet Prep HPF POC: NONE SEEN
Trich, Wet Prep: NONE SEEN
YEAST WET PREP: NONE SEEN

## 2014-11-07 LAB — SAMPLE TO BLOOD BANK

## 2014-11-07 NOTE — ED Notes (Signed)
I gave the patient a cup of ice and a container of apple juice per OB nurse.

## 2014-11-07 NOTE — ED Notes (Signed)
Patient c/o vaginal bleeding onset this am states she noticed  Blood on tissue everytime she wiped. States she hasn;'t  Felt the baby move all morning. No active bleeding at present. Fetal doppler  Used heart tones heard left quad. 152 felt abd. Movement when doppler applied. OB RR at bedside.

## 2014-11-07 NOTE — Discharge Instructions (Signed)
Tylenol for pain. Follow up closely with your OB/GYN. Return to [facility] if any problems.    Abdominal Pain During Pregnancy Abdominal pain is common in pregnancy. Most of the time, it does not cause harm. There are many causes of abdominal pain. Some causes are more serious than others. Some of the causes of abdominal pain in pregnancy are easily diagnosed. Occasionally, the diagnosis takes time to understand. Other times, the cause is not determined. Abdominal pain can be a sign that something is very wrong with the pregnancy, or the pain may have nothing to do with the pregnancy at all. For this reason, always tell your health care provider if you have any abdominal discomfort. HOME CARE INSTRUCTIONS  Monitor your abdominal pain for any changes. The following actions may help to alleviate any discomfort you are experiencing:  Do not have sexual intercourse or put anything in your vagina until your symptoms go away completely.  Get plenty of rest until your pain improves.  Drink clear fluids if you feel nauseous. Avoid solid food as long as you are uncomfortable or nauseous.  Only take over-the-counter or prescription medicine as directed by your health care provider.  Keep all follow-up appointments with your health care provider. SEEK IMMEDIATE MEDICAL CARE IF:  You are bleeding, leaking fluid, or passing tissue from the vagina.  You have increasing pain or cramping.  You have persistent vomiting.  You have painful or bloody urination.  You have a fever.  You notice a decrease in your baby's movements.  You have extreme weakness or feel faint.  You have shortness of breath, with or without abdominal pain.  You develop a severe headache with abdominal pain.  You have abnormal vaginal discharge with abdominal pain.  You have persistent diarrhea.  You have abdominal pain that continues even after rest, or gets worse. MAKE SURE YOU:   Understand these  instructions.  Will watch your condition.  Will get help right away if you are not doing well or get worse. Document Released: 12/08/2005 Document Revised: 09/28/2013 Document Reviewed: 07/07/2013 [facility] Patient Information 2015 [place], [place]. This information is not intended to replace advice given to you by your health care provider. Make sure you discuss any questions you have with your health care provider.

## 2014-11-07 NOTE — Progress Notes (Addendum)
0958  Arrived to evaluate this 28 yo G2P1 @ 23.[redacted] wks GA in with report of lower abdominal cramping, scant amount of vaginal bleeding, and decreased fetal movement.  Patient reports blood when wiping @ 5 am, then again in underwear later in the morning.  Intercourse last night.  Denies leaking of fluid.  No blood noted on pad or at introitus.  Abdomen is soft and non tender to palpation.  Fetal movement palpated by [facility] RN and now felt by patient.  1020  FHR 125, reassuring for gestational age.  72  Dr. Charlesetta Garibaldi notified of patient in ED, of complaints, of OB history, ED plan of care, and of EFM results.  She requests ED provider order OB limited US and if this is normal patient can be OB cleared.  1149 To [place].  [place] from [place], denies cramping or further bleeding, fetal movement palpated. 1309  EFM D/C'd, fetal movement palpated.  Patient denies complaints.

## 2014-11-07 NOTE — ED Provider Notes (Signed)
CSN: [id number]     Arrival date & time 11/07/14  0911 History   First MD Initiated Contact with Patient 11/07/14 [phone]     Chief Complaint  Patient presents with  . Vaginal Bleeding     (Consider location/radiation/quality/duration/timing/severity/associated sxs/prior Treatment) HPI Lauren Solis is a 28 y.o. female, G2P1 at approximately [redacted] weeks pregnant, presents with lower abdominal cramping and bright red vaginal bleeding. Pt states symptoms began this morning. Unremarkable pregnancy to this date. No problems with prior prengnancy. PT states cramping is constant. Denies watery discharge. Denies contraction like pain. No urinary symptoms. No other complaints. Pt states she also has not felt baby moving this morning.    History reviewed. No pertinent past medical history. Past Surgical History  Procedure Laterality Date  . Oophorectomy  2010   Family History  Problem Relation Age of Onset  . Cancer Maternal Aunt     Breast  . Diabetes Maternal Aunt   . Diabetes Maternal Uncle   . Kidney disease Maternal Grandmother    History  Substance Use Topics  . Smoking status: Never Smoker   . Smokeless tobacco: Not on file  . Alcohol Use: No   OB History    Gravida Para Term Preterm AB TAB SAB Ectopic Multiple Living   1              Review of Systems  Constitutional: Negative for fever and chills.  Respiratory: Negative for cough, chest tightness and shortness of breath.   Cardiovascular: Negative for chest pain, palpitations and leg swelling.  Gastrointestinal: Positive for abdominal pain. Negative for nausea, vomiting and diarrhea.  Genitourinary: Positive for vaginal bleeding and pelvic pain. Negative for dysuria, flank pain, vaginal discharge and vaginal pain.  Musculoskeletal: Negative for myalgias, arthralgias, neck pain and neck stiffness.  Skin: Negative for rash.  Neurological: Negative for dizziness, weakness and headaches.  All other systems reviewed and are  negative.     Allergies  Review of patient's allergies indicates no known allergies.  Home Medications   Prior to Admission medications   Medication Sig Start Date End Date Taking? Authorizing Provider  fluconazole (DIFLUCAN) 200 MG tablet Take 200 mg by mouth daily.    Historical Provider, MD  metroNIDAZOLE (METROGEL) 0.75 % vaginal gel Place 1 Applicatorful vaginally 2 (two) times daily.    Jearld Fenton, NP  Multiple Vitamins-Minerals (HAIR VITAMINS PO) Take 2 capsules by mouth daily.    Historical Provider, MD   BP 113/62 mmHg  Pulse 92  Temp(Src) 97.9 F (36.6 C) (Oral)  Resp 15  Ht 5' 6.5" (1.689 m)  Wt 160 lb (72.576 kg)  BMI 25.44 kg/m2  SpO2 98%  LMP 04/14/2014 Physical Exam  Constitutional: She appears well-developed and well-nourished. No distress.  HENT:  Head: Normocephalic.  Eyes: Conjunctivae are normal.  Neck: Neck supple.  Cardiovascular: Normal rate, regular rhythm and normal heart sounds.   Pulmonary/Chest: Effort normal and breath sounds normal. No respiratory distress. She has no wheezes. She has no rales.  Abdominal: Soft. Bowel sounds are normal. She exhibits no distension. There is tenderness. There is no rebound.  Lower abdominal tenderness. Left upper quadrant tenderness. Gravid  Genitourinary:  Thin brownish watery discharge. Cervix closed.   Musculoskeletal: She exhibits no edema.  Neurological: She is alert.  Skin: Skin is warm and dry.  Psychiatric: She has a normal mood and affect. Her behavior is normal.  Nursing note and vitals reviewed.   ED Course  Procedures (including  critical care time) Labs Review Labs Reviewed  WET PREP, GENITAL - Abnormal; Notable for the following:    WBC, Wet Prep HPF POC FEW (*)    All other components within normal limits  CBC WITH DIFFERENTIAL - Abnormal; Notable for the following:    Hemoglobin 11.7 (*)    HCT 32.7 (*)    MCV 76.2 (*)    All other components within normal limits  I-STAT CHEM 8,  ED - Abnormal; Notable for the following:    Potassium 3.4 (*)    BUN 5 (*)    All other components within normal limits  GC/CHLAMYDIA PROBE AMP  SAMPLE TO BLOOD BANK    Imaging Review US Ob Limited  11/07/2014   CLINICAL DATA:  Vaginal spotting. Patient pregnant. Second trimester. Estimated gestational age based on the last menstrual period is 25 weeks.  EXAM: LIMITED OBSTETRIC ULTRASOUND  FINDINGS: Number of Fetuses: 1  Heart Rate:  136 bpm  Movement: Yes  Presentation: Cephalic  Placental Location: Anterior  Previa: No  Amniotic Fluid (Subjective):  Within normal limits.  BPD:  6.3cm 25w  3d  MATERNAL FINDINGS:  Cervix:  Appears closed.  Measures 3.3 cm in length.  Uterus/Adnexae:  No abnormality visualized.  IMPRESSION: 1. Single live intrauterine pregnancy with a measured gestational age of [redacted] weeks and 3 days, concordant with the expected age based on the last menstrual period. 2. No emergent fetal or maternal complication. Cervix is closed and normal in length.  This exam is performed on an emergent basis and does not comprehensively evaluate fetal size, dating, or anatomy; follow-up complete OB US should be considered if further fetal assessment is warranted.   Electronically Signed   By: Lajean Manes M.D.   On: 11/07/2014 12:56     EKG Interpretation None      MDM   Final diagnoses:  Abdominal pain in pregnancy     9:55 AM Pelvic exam showing light brown, thin discharge. Cervix closed. Fetal heart tones 156. OB nurse on the way.    9:56 AM OB nurse at bedside.   1:11 PM Baby appears to be normal on tocometer. Pt has no more bleeding. [place] was ordered and appears normal. Pt was cleared by OB. Will d/c home with close outpatient follow up. Return precautions discussed.   Filed Vitals:   11/07/14 0932 11/07/14 1132 11/07/14 1254  BP: 113/62 103/56   Pulse: 92 68   Temp: 97.9 F (36.6 C)    TempSrc: Oral    Resp: 15 16 18   Height: 5' 6.5" (1.689 m)    Weight: 160 lb  (72.576 kg)    SpO2: 98% 98%      Renold Genta, PA-C 11/07/14 [place], MD 11/15/14 1333

## 2014-11-07 NOTE — ED Notes (Signed)
Pt is approximately 24-[redacted] weeks pregnant.  Pt reports she woke this morning at 0530 with vaginal bleeding and cramping.  Pt reports she hasn't felt any fetal movements since 0530.  Pt states she had a bowl of cereal, but still no fetal movement.  Pt reports bright red blood in underwear and when she wipes.

## 2014-11-07 NOTE — ED Notes (Signed)
Patient returned from US.

## 2014-11-08 LAB — GC/CHLAMYDIA PROBE AMP
CT PROBE, AMP APTIMA: NEGATIVE
GC PROBE AMP APTIMA: NEGATIVE

## 2014-12-22 NOTE — L&D Delivery Note (Signed)
1220: In room to assess after laboring down for 45 minutes.  Patient reporting more rectal pressure, but states not constant.  Pushing trial attempted with good results and progress.  Patient prepped for delivery and requested mirror.  FHR remained Cat I throughout the 2nd stage. Patient delivered as below.  Delivery Note At 12:38 PM a viable female "[name]" was delivered via Vaginal, Spontaneous Delivery (Presentation: Left Occiput Anterior). Shoulders delivered easily and mother allowed to reach down and embrace infant after delivery of shoulders and bring to abdomen. Infant with good tone and spontaneous cry and tactile stimulation given by provider and nurse.  Infant APGARs: 9, 9. Bleeding noted and vaginal inspection revealed a clitoral laceration.  Repaired completed with interrupted sutures and education provided, to patient, regarding type, location, and possible outcomes of repair (i.e. Increased, decreased, or no change in clitoral sensitivity).  Patient and husband verbalized understanding, questions and concerns addressed. Cord clamped, cut, and blood collected. Placenta delivered spontaneously and noted to be intact with 3VC upon inspection.  Fundus firm, at the umbilicus, but bleeding moderate.  Manual inspection yielded ~139mL of clots and trailing membranes removed with ring forceps.  Patient tolerated well and 103mcg of Cytotec placed rectally for prophylaxis.  Mother hemodynamically stable and infant skin to skin prior to provider exit.  Mother desires BTL and opts to breastfeed.  Family wishes for infant to be circumcised during inpatient stay.  Infant weight at one hour of life: 7lbs 9.5oz, 19.5 in  Anesthesia: Epidural  Episiotomy: None Lacerations:  Clitoral Suture Repair: 3.0 vicryl on sh needle Est. Blood Loss (mL): 500  Mom to postpartum.  Baby to Couplet care / Skin to Skin.  [name], [name] MSN, CNM 02/12/2015, 1:19 PM

## 2014-12-28 ENCOUNTER — Encounter (HOSPITAL_COMMUNITY): Payer: Self-pay | Admitting: *Deleted

## 2014-12-28 ENCOUNTER — Observation Stay (HOSPITAL_COMMUNITY)
Admission: AD | Admit: 2014-12-28 | Discharge: 2014-12-29 | Disposition: A | Discharge status: Home / Self Care | Payer: BLUE CROSS/BLUE SHIELD | Source: Ambulatory Visit | Attending: Obstetrics and Gynecology | Admitting: Obstetrics and Gynecology

## 2014-12-28 DIAGNOSIS — M79606 Pain in leg, unspecified: Secondary | ICD-10-CM | POA: Diagnosis present

## 2014-12-28 DIAGNOSIS — I8390 Asymptomatic varicose veins of unspecified lower extremity: Secondary | ICD-10-CM | POA: Diagnosis present

## 2014-12-28 DIAGNOSIS — M25571 Pain in right ankle and joints of right foot: Secondary | ICD-10-CM | POA: Diagnosis present

## 2014-12-28 DIAGNOSIS — M79604 Pain in right leg: Secondary | ICD-10-CM | POA: Diagnosis not present

## 2014-12-28 MED ORDER — DOCUSATE SODIUM 100 MG PO CAPS
100.0000 mg | ORAL_CAPSULE | Freq: Every day | ORAL | Status: DC
  Administered 2014-12-29: 100 mg via ORAL
  Filled 2014-12-28: qty 1

## 2014-12-28 MED ORDER — FAMOTIDINE 20 MG PO TABS
20.0000 mg | ORAL_TABLET | Freq: Two times a day (BID) | ORAL | Status: DC
  Administered 2014-12-28 – 2014-12-29 (×2): 20 mg via ORAL
  Filled 2014-12-28 (×2): qty 1

## 2014-12-28 MED ORDER — PRENATAL MULTIVITAMIN CH: 1.0000 | ORAL_TABLET | Freq: Every day | ORAL | Status: DC

## 2014-12-28 MED ORDER — ZOLPIDEM TARTRATE 5 MG PO TABS: 5.0000 mg | ORAL_TABLET | Freq: Every evening | ORAL | Status: DC | PRN

## 2014-12-28 MED ORDER — ACETAMINOPHEN 325 MG PO TABS: 650.0000 mg | ORAL_TABLET | ORAL | Status: DC | PRN

## 2014-12-28 MED ORDER — CALCIUM CARBONATE ANTACID 500 MG PO CHEW
2.0000 | CHEWABLE_TABLET | ORAL | Status: DC | PRN
  Administered 2014-12-28: 400 mg via ORAL
  Filled 2014-12-28: qty 1

## 2014-12-28 NOTE — MAU Note (Signed)
Inside of R ankle has been painful during entire pregnancy, last 3 days has been much worse.  Has become swollen & more painful today, hot to touch, purplish color.  C/O occasional uc/s, denies bleeding or LOF.

## 2014-12-28 NOTE — MAU Provider Note (Signed)
Lauren Solis is a 29 y.o. G2P1001 at 31.5 weeks present to MAU c/o right ankle pain, swelling and discoloration.  She denies hitting or falling.     History     There are no active problems to display for this patient.   Chief Complaint  Patient presents with  . Ankle Pain   HPI  OB History    Gravida Para Term Preterm AB TAB SAB Ectopic Multiple Living   2 1 1  0 0 0 0 0 0 1      Past Medical History  Diagnosis Date  . Difficult intubation     large tonsils per patient  . Medical history non-contributory     Past Surgical History  Procedure Laterality Date  . Oophorectomy  2010    Family History  Problem Relation Age of Onset  . Cancer Maternal Aunt     Breast  . Diabetes Maternal Aunt   . Diabetes Maternal Uncle   . Kidney disease Maternal Grandmother     History  Substance Use Topics  . Smoking status: Never Smoker   . Smokeless tobacco: Not on file  . Alcohol Use: No    Allergies: No Known Allergies  Prescriptions prior to admission  Medication Sig Dispense Refill Last Dose  . metroNIDAZOLE (METROGEL) 0.75 % vaginal gel Place 1 Applicatorful vaginally 2 (two) times daily. (Patient not taking: Reported on 11/07/2014) 70 g 0     ROS See HPI above, all other systems are negative  Physical Exam   Blood pressure 114/75, pulse 90, temperature 98.1 F (36.7 C), temperature source Oral, resp. rate 16, last menstrual period 04/14/2014.  Physical Exam Ext:  WNL ABD: Soft, non tender to palpation, no rebound or guarding SVE: deferred   ED Course  Assessment: IUP at  31.5weeks Membranes: intact FHR: Category 1 CTX:  none  Warmth and swelling in the right lower extremity with addition of discoloration and tenderness  Plan: Consult with Dr Mancel Bale Pt given 3 options 1) Stay in MAU, don't given Lovenox and go to [facility] in the morning 2) Give Lovenox and go to [facility] in the morning 3) Don't give Lovenox and go to [facility] in the morning Pt elected to  stay and go to [facility] in the morning for doppler studies.   NST prior to go to [facility]  Lovenox 1mg /kg   [employer], CNM, MSN 12/28/2014. 8:17 PM

## 2014-12-29 DIAGNOSIS — I8390 Asymptomatic varicose veins of unspecified lower extremity: Secondary | ICD-10-CM | POA: Diagnosis present

## 2014-12-29 DIAGNOSIS — M79609 Pain in unspecified limb: Secondary | ICD-10-CM

## 2014-12-29 NOTE — Progress Notes (Signed)
*  Preliminary Results* Right lower extremity venous duplex completed. Right lower extremity is negative for deep vein thrombosis. There is no evidence of right Baker's cyst.  Of note- While speaking with the patient she described a prior episode of shortness of breath with no specified time of occurrence. I discussed this as well as the preliminary results with her RN Gerald Stabs.  12/29/2014 8:52 AM  Maudry Mayhew, RVT, RDCS, RDMS

## 2014-12-29 NOTE — H&P (Signed)
Lauren Solis is a 29 y.o. female, G3 P0111 at 31.5 weeks, admitted as observation to [facility]  Patient Active Problem List   Diagnosis Date Noted  . Leg pain, inferior 12/28/2014    Pregnancy Course: Patient entered care at 16.3 weeks.   EDC of 02/24/15 was established by LMP.      [place] evaluations:   19.6 weeks - Anatomy:   EFW= 15 oz. > 97.0%tile. Vertex presentation. Anterior lateral placenta. Placental edge WNL. Cx closed. AFI WNL. Vertical pocket = 5.2 cm. No abnormalities seen, All anatomy seen. Female gender. Bilateral pyelectasis noted. R= 0.50 cm, L =0.44cm. Ovaries and adnexas wnl. F/U Bilateral pyelectasis at 28 weeks.   28.6 weeks - FU: AUA 31.0, EFW 3lb 11oz = 97%, AFI 13.8, FHR 145, cervix 3.44, vertex, left lateral placenta, normal fluid.  Fetal kidneys are now wnl RK 0.45, LR 0.31   Significant prenatal events:   Late to care, hx of PTD at 36 weeks d/t PPROM, HPV positive Last evaluation:   30.2 weeks   VE:0/10/-3 on 12/18/14  Reason for admission:  Observation prior to doppler study at [facility]  Pt States:   Contractions Frequency: None         Contraction severity: n/a         Fetal activity: +FM  OB History    Gravida Para Term Preterm AB TAB SAB Ectopic Multiple Living   2 1 1  0 0 0 0 0 0 1     Past Medical History  Diagnosis Date  . Difficult intubation     large tonsils per patient  . Medical history non-contributory    Past Surgical History  Procedure Laterality Date  . Oophorectomy  2010   Family History: family history includes Cancer in her maternal aunt; Diabetes in her maternal aunt and maternal uncle; Kidney disease in her maternal grandmother. Social History:  reports that she has never smoked. She does not have any smokeless tobacco history on file. She reports that she does not drink alcohol or use illicit drugs.   Prenatal Transfer Tool  Maternal Diabetes: No Genetic Screening: Normal Maternal Ultrasounds/Referrals: Abnormal:  Findings:   Fetal  renal pyelectasis bilateral Fetal Ultrasounds or other Referrals:  Referred to Materal Fetal Medicine  Maternal Substance Abuse:  No Significant Maternal Medications:  None Significant Maternal Lab Results: Lab values include: Other: abn pap    ROS:  See HPI above, all other systems are negative  No Known Allergies    Blood pressure 113/64, pulse 82, temperature 98.1 F (36.7 C), temperature source Oral, resp. rate 18, height 5' 6.5" (1.689 m), weight 183 lb 3 oz (83.093 kg), last menstrual period 04/14/2014, SpO2 100 %.  Maternal Exam:  Uterine Assessment: Contraction frequency is rare.  Abdomen: Gravid, non tender. Fundal height is aga.  Fetal presentation: Vertex by Leopold's   Fetal Exam:  Monitor Surveillance : NST q shift Mode: Ultrasound.  NICHD: Category CTXs: none EFW   4 lbs  Physical Exam: Nursing note and vitals reviewed General: alert and cooperative She appears well nourished Psychiatric: Normal mood and affect. Her behavior is normal Head: Normocephalic Eyes: Pupils are equal, round, and reactive to light Neck: Normal range of motion Cardiovascular: RRR without murmur  Respiratory: CTAB. Effort normal  Abd: soft, non-tender, +BS, no rebound, no guarding  Genitourinary: Vagina normal  Neurological: A&Ox3 Skin: Warm and dry  Musculoskeletal: Normal range of motion  Homan's sign negative bilaterally No evidence of DVTs.  Edema: No  edema DTR: 2+ Clonus: None   Prenatal labs: ABO, Rh:  A positive Antibody:  negative Rubella:   immune RPR: NON REAC (05/15 1001)  HBsAg:   negative HIV: NONREACTIVE (05/15 1001)  GBS:  unknown Sickle cell/Hgb electrophoresis:  AC Pap:  ASCUC 09/12/14 GC:   negative Chlamydia: negative Genetic screenings:  Negative Glucola:  wnl  Assessment:  IUP at 31.5 weeks NICHD: Category 1 Membranes: intact GBS unknown Diagnosis:   Plan:  Admit to Women[facility] unit Routing [facility] AP orders NST in the AM prior to  discharge  [name], CNM, MSN 12/29/2014, 5:25 AM       All information will be confirmed upon admisson

## 2014-12-29 NOTE — Discharge Summary (Signed)
Physician Discharge Summary  Patient ID: Lauren Solis MRN: [id number] DOB/AGE: 1986-10-22 28 y.o.  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  Discharged Condition: stable  Hospital Course: Admittted 12/28/14 with c/o of right ankle pain and swelling, with no hx of trauma.  Has has issues with right leg varicosities.  Negative Homan's sign, and normal VS.  Patient declined Lovenox administration.  Due to late hour of admission, doppler study was deferred until the am of 12/29/14.  Doppler study of right LE was negative.  Physical exam was WNL, and patient was d/c'd home.  Consults: None  Significant Diagnostic Studies:  Venous duplex of right lower extremity  Treatments: anticoagulation: none pt refused  Discharge Exam: Blood pressure 113/64, pulse 82, temperature 98.1 F (36.7 C), temperature source Oral, resp. rate 18, height 5' 6.5" (1.689 m), weight 183 lb 3 oz (83.093 kg), last menstrual period 04/14/2014, SpO2 100 %.  Chest clear Heart RRR without murmur Abd gravid, NT--NST reactive 12/29/14 Pelvic--deferred Ext--right inner ankle noted to have patch of superficial varicosities, mildly tender and erythematous.  Superficial varicosity note on inner surfact of knee and in right groin.  + dorsalis pedis, popliteal, and femoral pulses noted in right leg on exam.  Good capillary refill in right toes.  Patient to continue to wear compression hose as previously utilized, and to add additional compression sleeve to right ankle for support and comfort.  Disposition: Home undelivered  Patient to continue to wear compression hose as previously utilized, and to add additional compression sleeve to right ankle for support and comfort.  F/u as scheduled 01/05/15 or prn.  Note for work on Monday, note for patient's husband for today.   Discharge Instructions     Discharge activity:  No Restrictions    Complete by:  As directed      Discharge diet:  No restrictions    Complete by:  As directed      Discharge instructions    Complete by:  As directed   Per [facility] handout     LABOR:  When conractions begin, you should start to time them from the beginning of one contraction to the beginning  of the next.  When contractions are 5 - 10 minutes apart or less and have been regular for at least an hour, you should call your health care provider.    Complete by:  As directed      No sexual activity restrictions    Complete by:  As directed      Notify physician for a general feeling that "something is not right"    Complete by:  As directed      Notify physician for bleeding from the vagina    Complete by:  As directed      Notify physician for blurring of vision or spots before the eyes    Complete by:  As directed      Notify physician for chills or fever    Complete by:  As directed      Notify physician for fainting spells, "black outs" or loss of consciousness    Complete by:  As directed      Notify physician for increase in vaginal discharge    Complete by:  As directed      Notify physician for increase or change in vaginal discharge    Complete by:  As directed  Notify physician for intestinal cramps, with or without diarrhea, sometimes described as "gas pain"    Complete by:  As directed      Notify physician for leaking of fluid    Complete by:  As directed      Notify physician for leaking of fluid    Complete by:  As directed      Notify physician for low, dull backache, unrelieved by heat or Tylenol    Complete by:  As directed      Notify physician for menstrual like cramps    Complete by:  As directed      Notify physician for pain or burning when urinating    Complete by:  As directed      Notify physician for pelvic pressure (sudden increase)    Complete by:  As directed      Notify physician for pelvic pressure     Complete by:  As directed      Notify physician for severe or continued nausea or vomiting    Complete by:  As directed      Notify physician for sudden gushing of fluid from the vagina (with or without continued leaking)    Complete by:  As directed      Notify physician for sudden, constant, or occasional abdominal pain    Complete by:  As directed      Notify physician for uterine contractions.  These may be painless and feel like the uterus is tightening or the baby is  "balling up"    Complete by:  As directed      Notify physician for vaginal bleeding    Complete by:  As directed      Notify physician if baby moving less than usual    Complete by:  As directed      PRETERM LABOR:  Includes any of the follwing symptoms that occur between 20 - [redacted] weeks gestation.  If these symptoms are not stopped, preterm labor can result in preterm delivery, placing your baby at risk    Complete by:  As directed             Medication List    TAKE these medications        metroNIDAZOLE 0.75 % vaginal gel  Commonly known as:  METROGEL  Place 1 Applicatorful vaginally 2 (two) times daily.           Follow-up Information    Follow up with [facility].   Why:  for Doppler study      Follow up with [facility]. Schedule an appointment as soon as possible for a visit in 1 week.   Specialty:  Obstetrics and Gynecology   Why:  Keep appointment, Call with any questions or concerns, If symptoms worsen, As needed   Contact information:   [address] [id number] [phone]       Signed: Sallee Provencal, CNM, MSN 12/29/2014, 5:53 AM   Addendums by Donnel Saxon, CNM 12/29/14 0930

## 2014-12-29 NOTE — Discharge Instructions (Signed)
Varicose Veins Varicose veins are veins that have become enlarged and twisted. CAUSES This condition is the result of valves in the veins not working properly. Valves in the veins help return blood from the leg to the heart. If these valves are damaged, blood flows backwards and backs up into the veins in the leg near the skin. This causes the veins to become larger. People who are on their feet a lot, who are pregnant, or who are overweight are more likely to develop varicose veins. SYMPTOMS   Bulging, twisted-appearing, bluish veins, most commonly found on the legs.  Leg pain or a feeling of heaviness. These symptoms may be worse at the end of the day.  Leg swelling.  Skin color changes. DIAGNOSIS  Varicose veins can usually be diagnosed with an exam of your legs by your caregiver. He or she may recommend an ultrasound of your leg veins. TREATMENT  Most varicose veins can be treated at home.However, other treatments are available for people who have persistent symptoms or who want to treat the cosmetic appearance of the varicose veins. These include:  Laser treatment of very small varicose veins.  Medicine that is shot (injected) into the vein. This medicine hardens the walls of the vein and closes off the vein. This treatment is called sclerotherapy. Afterwards, you may need to wear clothing or bandages that apply pressure.  Surgery. HOME CARE INSTRUCTIONS   Do not stand or sit in one position for long periods of time. Do not sit with your legs crossed. Rest with your legs raised during the day.  Wear elastic stockings or support hose. Do not wear other tight, encircling garments around the legs, pelvis, or waist.  Walk as much as possible to increase blood flow.  Raise the foot of your bed at night with 2-inch blocks.  If you get a cut in the skin over the vein and the vein bleeds, lie down with your leg raised and press on it with a clean cloth until the bleeding stops. Then  place a bandage (dressing) on the cut. See your caregiver if it continues to bleed or needs stitches. SEEK MEDICAL CARE IF:   The skin around your ankle starts to break down.  You have pain, redness, tenderness, or hard swelling developing in your leg over a vein.  You are uncomfortable due to leg pain. Document Released: 09/17/2005 Document Revised: 03/01/2012 Document Reviewed: 02/03/2011 Exit[employer] Patient Information 2015 [employer]. This information is not intended to replace advice given to you by your health care provider. Make sure you discuss any questions you have with your health care provider.  

## 2014-12-29 NOTE — Progress Notes (Signed)
Pt out in wheelchair teaching complete and doppler studies complete

## 2015-01-12 ENCOUNTER — Other Ambulatory Visit: Payer: Self-pay | Admitting: Obstetrics and Gynecology

## 2015-02-01 LAB — OB RESULTS CONSOLE GBS: GBS: NEGATIVE

## 2015-02-12 ENCOUNTER — Encounter (HOSPITAL_COMMUNITY): Payer: Self-pay | Admitting: *Deleted

## 2015-02-12 ENCOUNTER — Inpatient Hospital Stay (HOSPITAL_COMMUNITY): Payer: Medicaid Other | Admitting: Anesthesiology

## 2015-02-12 ENCOUNTER — Inpatient Hospital Stay (HOSPITAL_COMMUNITY)
Admission: AD | Admit: 2015-02-12 | Discharge: 2015-02-14 | DRG: 767 | Disposition: A | Discharge status: Home / Self Care | Payer: Medicaid Other | Source: Ambulatory Visit | Attending: Obstetrics and Gynecology | Admitting: Obstetrics and Gynecology

## 2015-02-12 DIAGNOSIS — Z3A39 39 weeks gestation of pregnancy: Secondary | ICD-10-CM

## 2015-02-12 DIAGNOSIS — D582 Other hemoglobinopathies: Secondary | ICD-10-CM | POA: Diagnosis not present

## 2015-02-12 DIAGNOSIS — M79604 Pain in right leg: Secondary | ICD-10-CM

## 2015-02-12 DIAGNOSIS — O2203 Varicose veins of lower extremity in pregnancy, third trimester: Secondary | ICD-10-CM | POA: Diagnosis not present

## 2015-02-12 DIAGNOSIS — Z9851 Tubal ligation status: Secondary | ICD-10-CM

## 2015-02-12 DIAGNOSIS — Z90721 Acquired absence of ovaries, unilateral: Secondary | ICD-10-CM

## 2015-02-12 DIAGNOSIS — O9902 Anemia complicating childbirth: Secondary | ICD-10-CM | POA: Diagnosis not present

## 2015-02-12 DIAGNOSIS — Z302 Encounter for sterilization: Secondary | ICD-10-CM

## 2015-02-12 LAB — CBC
HCT: 34.2 % — ABNORMAL LOW (ref 36.0–46.0)
HCT: 32.9 % — ABNORMAL LOW (ref 36.0–46.0)
HEMOGLOBIN: 11.6 g/dL — AB (ref 12.0–15.0)
Hemoglobin: 11.3 g/dL — ABNORMAL LOW (ref 12.0–15.0)
MCH: 22.9 pg — ABNORMAL LOW (ref 26.0–34.0)
MCH: 23.3 pg — ABNORMAL LOW (ref 26.0–34.0)
MCHC: 33.9 g/dL (ref 30.0–36.0)
MCHC: 34.3 g/dL (ref 30.0–36.0)
MCV: 67.6 fL — AB (ref 78.0–100.0)
MCV: 68 fL — AB (ref 78.0–100.0)
Platelets: 199 10*3/uL (ref 150–400)
Platelets: 184 10*3/uL (ref 150–400)
RBC: 5.06 MIL/uL (ref 3.87–5.11)
RBC: 4.84 MIL/uL (ref 3.87–5.11)
RDW: 18 % — AB (ref 11.5–15.5)
RDW: 17.6 % — ABNORMAL HIGH (ref 11.5–15.5)
WBC: 7.7 10*3/uL (ref 4.0–10.5)
WBC: 8 10*3/uL (ref 4.0–10.5)

## 2015-02-12 LAB — COMPREHENSIVE METABOLIC PANEL
ALT: 14 U/L (ref 0–35)
AST: 18 U/L (ref 0–37)
Albumin: 2.9 g/dL — ABNORMAL LOW (ref 3.5–5.2)
Alkaline Phosphatase: 170 U/L — ABNORMAL HIGH (ref 39–117)
Anion gap: 4 — ABNORMAL LOW (ref 5–15)
BILIRUBIN TOTAL: 0.9 mg/dL (ref 0.3–1.2)
BUN: 10 mg/dL (ref 6–23)
CO2: 20 mmol/L (ref 19–32)
Calcium: 8.3 mg/dL — ABNORMAL LOW (ref 8.4–10.5)
Chloride: 107 mmol/L (ref 96–112)
Creatinine, Ser: 0.56 mg/dL (ref 0.50–1.10)
GLUCOSE: 104 mg/dL — AB (ref 70–99)
POTASSIUM: 3.7 mmol/L (ref 3.5–5.1)
Sodium: 131 mmol/L — ABNORMAL LOW (ref 135–145)
Total Protein: 7.3 g/dL (ref 6.0–8.3)

## 2015-02-12 LAB — TYPE AND SCREEN
ABO/RH(D): A POS
Antibody Screen: NEGATIVE

## 2015-02-12 LAB — RPR: RPR: NONREACTIVE

## 2015-02-12 LAB — LACTATE DEHYDROGENASE: LDH: 145 U/L (ref 94–250)

## 2015-02-12 LAB — HIV ANTIBODY (ROUTINE TESTING W REFLEX): HIV Screen 4th Generation wRfx: NONREACTIVE

## 2015-02-12 LAB — URIC ACID: Uric Acid, Serum: 5 mg/dL (ref 2.4–7.0)

## 2015-02-12 MED ORDER — ACETAMINOPHEN 325 MG PO TABS: 650.0000 mg | ORAL_TABLET | ORAL | Status: DC | PRN

## 2015-02-12 MED ORDER — OXYCODONE-ACETAMINOPHEN 5-325 MG PO TABS
1.0000 | ORAL_TABLET | ORAL | Status: DC | PRN
  Administered 2015-02-12: 1 via ORAL
  Filled 2015-02-12: qty 1

## 2015-02-12 MED ORDER — PHENYLEPHRINE 40 MCG/ML (10ML) SYRINGE FOR IV PUSH (FOR BLOOD PRESSURE SUPPORT): 80.0000 ug | PREFILLED_SYRINGE | INTRAVENOUS | Status: DC | PRN

## 2015-02-12 MED ORDER — IBUPROFEN 600 MG PO TABS
600.0000 mg | ORAL_TABLET | Freq: Four times a day (QID) | ORAL | Status: DC
  Administered 2015-02-12 – 2015-02-14 (×7): 600 mg via ORAL
  Filled 2015-02-12 (×7): qty 1

## 2015-02-12 MED ORDER — OXYCODONE-ACETAMINOPHEN 5-325 MG PO TABS: 2.0000 | ORAL_TABLET | ORAL | Status: DC | PRN

## 2015-02-12 MED ORDER — TETANUS-DIPHTH-ACELL PERTUSSIS 5-2.5-18.5 LF-MCG/0.5 IM SUSP: 0.5000 mL | Freq: Once | INTRAMUSCULAR | Status: DC

## 2015-02-12 MED ORDER — ONDANSETRON HCL 4 MG PO TABS: 4.0000 mg | ORAL_TABLET | ORAL | Status: DC | PRN

## 2015-02-12 MED ORDER — DIPHENHYDRAMINE HCL 50 MG/ML IJ SOLN: 12.5000 mg | INTRAMUSCULAR | Status: DC | PRN

## 2015-02-12 MED ORDER — LACTATED RINGERS IV SOLN
INTRAVENOUS | Status: DC
  Administered 2015-02-12: 07:00:00 via INTRAVENOUS

## 2015-02-12 MED ORDER — BUTORPHANOL TARTRATE 1 MG/ML IJ SOLN
1.0000 mg | INTRAMUSCULAR | Status: DC | PRN
  Administered 2015-02-12: 1 mg via INTRAVENOUS
  Filled 2015-02-12: qty 1

## 2015-02-12 MED ORDER — ONDANSETRON HCL 4 MG/2ML IJ SOLN: 4.0000 mg | Freq: Four times a day (QID) | INTRAMUSCULAR | Status: DC | PRN

## 2015-02-12 MED ORDER — FENTANYL 2.5 MCG/ML BUPIVACAINE 1/10 % EPIDURAL INFUSION (WH - ANES)
14.0000 mL/h | INTRAMUSCULAR | Status: DC | PRN
  Administered 2015-02-12: 14 mL/h via EPIDURAL
  Filled 2015-02-12: qty 125

## 2015-02-12 MED ORDER — EPHEDRINE 5 MG/ML INJ: 10.0000 mg | INTRAVENOUS | Status: DC | PRN

## 2015-02-12 MED ORDER — MISOPROSTOL 200 MCG PO TABS
ORAL_TABLET | ORAL | Status: AC
  Filled 2015-02-12: qty 5

## 2015-02-12 MED ORDER — LANOLIN HYDROUS EX OINT: TOPICAL_OINTMENT | CUTANEOUS | Status: DC | PRN

## 2015-02-12 MED ORDER — LIDOCAINE HCL (PF) 1 % IJ SOLN
INTRAMUSCULAR | Status: DC | PRN
  Administered 2015-02-12: 6 mL
  Administered 2015-02-12: 4 mL

## 2015-02-12 MED ORDER — BENZOCAINE-MENTHOL 20-0.5 % EX AERO
1.0000 "application " | INHALATION_SPRAY | CUTANEOUS | Status: DC | PRN
  Filled 2015-02-12: qty 56

## 2015-02-12 MED ORDER — PRENATAL MULTIVITAMIN CH
1.0000 | ORAL_TABLET | Freq: Every day | ORAL | Status: DC
  Administered 2015-02-14: 1 via ORAL
  Filled 2015-02-12: qty 1

## 2015-02-12 MED ORDER — SENNOSIDES-DOCUSATE SODIUM 8.6-50 MG PO TABS
2.0000 | ORAL_TABLET | ORAL | Status: DC
  Administered 2015-02-12 – 2015-02-14 (×2): 2 via ORAL
  Filled 2015-02-12 (×2): qty 2

## 2015-02-12 MED ORDER — OXYTOCIN 40 UNITS IN LACTATED RINGERS INFUSION - SIMPLE MED
62.5000 mL/h | INTRAVENOUS | Status: DC
  Administered 2015-02-12: 62.5 mL/h via INTRAVENOUS
  Filled 2015-02-12: qty 1000

## 2015-02-12 MED ORDER — CITRIC ACID-SODIUM CITRATE 334-500 MG/5ML PO SOLN: 30.0000 mL | ORAL | Status: DC | PRN

## 2015-02-12 MED ORDER — ZOLPIDEM TARTRATE 5 MG PO TABS: 5.0000 mg | ORAL_TABLET | Freq: Every evening | ORAL | Status: DC | PRN

## 2015-02-12 MED ORDER — LACTATED RINGERS IV SOLN: 500.0000 mL | Freq: Once | INTRAVENOUS | Status: DC

## 2015-02-12 MED ORDER — ONDANSETRON HCL 4 MG/2ML IJ SOLN: 4.0000 mg | INTRAMUSCULAR | Status: DC | PRN

## 2015-02-12 MED ORDER — MISOPROSTOL 200 MCG PO TABS
1000.0000 ug | ORAL_TABLET | Freq: Once | ORAL | Status: AC
  Administered 2015-02-12: 1000 ug via RECTAL

## 2015-02-12 MED ORDER — PHENYLEPHRINE 40 MCG/ML (10ML) SYRINGE FOR IV PUSH (FOR BLOOD PRESSURE SUPPORT)
80.0000 ug | PREFILLED_SYRINGE | INTRAVENOUS | Status: DC | PRN
  Filled 2015-02-12: qty 20

## 2015-02-12 MED ORDER — FENTANYL 2.5 MCG/ML BUPIVACAINE 1/10 % EPIDURAL INFUSION (WH - ANES): 14.0000 mL/h | INTRAMUSCULAR | Status: DC | PRN

## 2015-02-12 MED ORDER — LIDOCAINE HCL (PF) 1 % IJ SOLN
30.0000 mL | INTRAMUSCULAR | Status: DC | PRN
  Filled 2015-02-12: qty 30

## 2015-02-12 MED ORDER — WITCH HAZEL-GLYCERIN EX PADS: 1.0000 "application " | MEDICATED_PAD | CUTANEOUS | Status: DC | PRN

## 2015-02-12 MED ORDER — OXYCODONE-ACETAMINOPHEN 5-325 MG PO TABS
2.0000 | ORAL_TABLET | ORAL | Status: DC | PRN
  Administered 2015-02-13 – 2015-02-14 (×6): 2 via ORAL
  Filled 2015-02-12 (×6): qty 2

## 2015-02-12 MED ORDER — LACTATED RINGERS IV SOLN: 500.0000 mL | INTRAVENOUS | Status: DC | PRN

## 2015-02-12 MED ORDER — DIPHENHYDRAMINE HCL 25 MG PO CAPS: 25.0000 mg | ORAL_CAPSULE | Freq: Four times a day (QID) | ORAL | Status: DC | PRN

## 2015-02-12 MED ORDER — OXYTOCIN BOLUS FROM INFUSION: 500.0000 mL | INTRAVENOUS | Status: DC

## 2015-02-12 MED ORDER — OXYCODONE-ACETAMINOPHEN 5-325 MG PO TABS: 1.0000 | ORAL_TABLET | ORAL | Status: DC | PRN

## 2015-02-12 MED ORDER — SIMETHICONE 80 MG PO CHEW: 80.0000 mg | CHEWABLE_TABLET | ORAL | Status: DC | PRN

## 2015-02-12 MED ORDER — DIBUCAINE 1 % RE OINT: 1.0000 "application " | TOPICAL_OINTMENT | RECTAL | Status: DC | PRN

## 2015-02-12 NOTE — H&P (Signed)
Lauren Solis is a 30 y.o. female, G3 P0111 at 39.6 weeks  Patient Active Problem List   Diagnosis Date Noted  . 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 Leg pain, inferior 12/28/2014    Pregnancy Course: Patient entered care at 16.3 weeks.   EDC of 02/13/15 was established by [place].      [place] evaluations:   19.6 weeks - Anatomy:  EFW= 15 oz.  > 97.0%tile.  Vertex presentation. Anterior lateral placenta. Placental edge WNL.  Cx closed.  AFI WNL.  Vertical pocket = 5.2 cm. No abnormalities seen, All anatomy seen. Female gender. Bilateral pyelectasis noted. R= 0.50 cm, L =0.44cm. Ovaries and adnexas wnl.  F/U Bilateral pyelectasis at 28 weeks.     28.6 weeks - FU: F/U pyelectasis. EFW- 3 lbs 11 oz. = 97%tile. FHT's 145. Vertex, anterior L lateral placenta. Nl fluid, AFI of 13.8 cm=45%tile. Fetal kidneys are now normal. RK= 0.45 cm and LK= 0.31 cm. Cx closed.  32.6 weeks - EFW/AFI.  EFW= 5 lbs 7 oz= 87.1%tile. Vertex pres. Anterior Left lateral placenta. AFI WNL. 20th %tile. Cx closed Adnexas are wnl. Pyelectasis is not seen on today's scan.  Significant prenatal events:   Hemoglobin C trait, hx PTD at 36 weeks   Last evaluation:   39.2 weeks   VE:3/40/-3 on 02/08/15  Reason for admission:  labor  Pt States:   Contractions Frequency: 2-3 minutes         Contraction severity: strong         Fetal activity: +FM  OB History    Gravida Para Term Preterm AB TAB SAB Ectopic Multiple Living   2 1 1  0 0 0 0 0 0 1     Past Medical History  Diagnosis Date  . Difficult intubation     large tonsils per patient  . Medical history non-contributory    Past Surgical History  Procedure Laterality Date  . Oophorectomy  2010   Family History: family history includes Cancer in her maternal aunt; Diabetes in her maternal aunt and maternal uncle; Kidney disease in her maternal grandmother. Social History:  reports that she has never smoked. She does not have any smokeless tobacco history on file. She  reports that she does not drink alcohol or use illicit drugs.   Prenatal Transfer Tool  Maternal Diabetes: No Genetic Screening: Normal Maternal Ultrasounds/Referrals: Normal Fetal Ultrasounds or other Referrals:  None, Referred to Materal Fetal Medicine 10/07/15 bilateral pyelectasis Maternal Substance Abuse:  No Significant Maternal Medications:  None Significant Maternal Lab Results: Lab values include: Other: Hemoglobin C Trait   ROS:  See HPI above, all other systems are negative  No Known Allergies  Dilation: 5.5 Effacement (%): 80 Station: -3 Exam by:: V Travis Mastel CNM Blood pressure 117/73, pulse 82, temperature 98.1 F (36.7 C), temperature source Oral, height 5\' 6"  (1.676 m), weight 204 lb 2 oz (92.59 kg), last menstrual period 04/14/2014.  Maternal Exam:  Uterine Assessment: Contraction frequency is rare.  Abdomen: Gravid, non tender. Fundal height is aga.  Normal external genitalia, vulva, cervix, uterus and adnexa.  No lesions noted on exam.  Pelvis adequate for delivery.  Fetal presentation: Vertex by Leopold's   Fetal Exam:  Monitor Surveillance : Continuous Monitoring  Mode: Ultrasound.  NICHD: Category 1 CTXs: Q 2-18minutes EFW   7 lbs  Physical Exam: Nursing note and vitals reviewed General: alert and cooperative She appears well nourished Psychiatric: Normal mood and affect. Her behavior is normal Head: Normocephalic Eyes:  Pupils are equal, round, and reactive to light Neck: Normal range of motion Cardiovascular: RRR without murmur  Respiratory: CTAB. Effort normal  Abd: soft, non-tender, +BS, no rebound, no guarding  Genitourinary: Vagina normal  Neurological: A&Ox3 Skin: Warm and dry  Musculoskeletal: Normal range of motion  Homan's sign negative bilaterally No evidence of DVTs.  Edema:Minimal bilaterally non-pitting edema DTR: 2+ Clonus: None   Prenatal labs: ABO, Rh:  A positive Antibody:  negative Rubella:   immune RPR: NON REAC  (05/15 1001)  HBsAg:   negative HIV: NONREACTIVE (05/15 1001)  GBS:  negative Sickle cell/Hgb electrophoresis:  WNL Pap:  ASCUS 09/12/14, +HPV GC:   negative Chlamydia: negative Genetic screenings:  negative Glucola:  wnl  Assessment:  IUP at 39.6 weeks NICHD: Category 1 Membranes: intact GBS negative   Plan:  Admit to L&D for expectant/active management of labor. Possible augmentation options reviewed including AROM and/or pitocin.  IV pain medication per orders PRN Epidural per patient request Foley cath after patient is comfortable with epidural Anticipate SVD  Labor mgmt as ordered    Attending MD available at all times.  Sonakshi Rolland, CNM, MSN 02/12/2015, 6:46 AM

## 2015-02-12 NOTE — Progress Notes (Signed)
LARRISSA STIVERS MRN: [id number]  Subjective: -Patient reports increased comfort and unable to feel "tightening" now.    Objective: BP 114/70 mmHg  Pulse 66  Temp(Src) 97.5 F (36.4 C) (Oral)  Resp 18  Ht 5\' 6"  (1.676 m)  Wt 204 lb 2 oz (92.59 kg)  BMI 32.96 kg/m2  SpO2 99%  LMP 04/14/2014     FHT: 115 bpm, Mod Var, -Decels, +Accels UC:  Q1-92min, palpates moderate to strong  SVE:   Dilation: 8 Effacement (%): 100 Station: -1 Exam by:: Rosezella Kronick, CNM Membranes: Attempted AROM-unsuccessful Pitocin: None Foley catheter in place  Assessment:  IUP at 38.3wks Cat II FT  Active Labor-Transition  Plan: -Continue expectant management -Aminiotomy unsuccessful  -Continue other mgmt as ordered  Rozalyn Osland, Bibi LYNN,MSN, CNM 02/12/2015, 9:52 AM

## 2015-02-12 NOTE — MAU Provider Note (Signed)
MAU Addendum Note  VE 5-6/80/-3   Plan: Admit to L&D  Calel Pisarski, CNM, MSN 02/12/2015. 6:45 AM

## 2015-02-12 NOTE — Progress Notes (Signed)
CHARLISE GIOVANETTI MRN: [id number]  Subjective: -Patient reports comfort s/p epidural.  States she is able to feel fetal movement and "tightening" with contractions.    Objective: BP 118/72 mmHg  Pulse 76  Temp(Src) 97.5 F (36.4 C) (Oral)  Resp 20  Ht 5\' 6"  (1.676 m)  Wt 204 lb 2 oz (92.59 kg)  BMI 32.96 kg/m2  SpO2 99%  LMP 04/14/2014     FHT: 115 bpm, Mod Var, -Decels, +Accels UC:  Q1-56min, palpates moderate to strong  SVE:   Deferred Membranes: Intact Pitocin: None  Assessment:  IUP at 38.3wks Cat I FT  Expectant Mgmt GBS Negative  Plan: -Will reassess cervix at 10am or sooner -Discussed when to call for assistance/evaluation-increased rectal pressure, rom, or pain -Continue other mgmt as ordered  Samera Macy, Nelda LYNN,MSN, CNM 02/12/2015, 9:11 AM

## 2015-02-12 NOTE — MAU Note (Signed)
Pt to be reexamined in one hour by V Standard CNM.

## 2015-02-12 NOTE — MAU Provider Note (Signed)
Lauren Solis is a 29 y.o. G2P1001 at 28.2 weeks, pt c/o ctx x 3 hours, with a increase in intensity and frequency.  She denies vb, lof with +FM   History     Patient Active Problem List   Diagnosis Date Noted  . 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 Leg pain, inferior 12/28/2014    No chief complaint on file.  HPI  OB History    Gravida Para Term Preterm AB TAB SAB Ectopic Multiple Living   2 1 1  0 0 0 0 0 0 1      Past Medical History  Diagnosis Date  . Difficult intubation     large tonsils per patient  . Medical history non-contributory     Past Surgical History  Procedure Laterality Date  . Oophorectomy  2010    Family History  Problem Relation Age of Onset  . Cancer Maternal Aunt     Breast  . Diabetes Maternal Aunt   . Diabetes Maternal Uncle   . Kidney disease Maternal Grandmother     History  Substance Use Topics  . Smoking status: Never Smoker   . Smokeless tobacco: Not on file  . Alcohol Use: No    Allergies: No Known Allergies  Prescriptions prior to admission  Medication Sig Dispense Refill Last Dose  . metroNIDAZOLE (METROGEL) 0.75 % vaginal gel Place 1 Applicatorful vaginally 2 (two) times daily. (Patient not taking: Reported on 11/07/2014) 70 g 0     ROS See HPI above, all other systems are negative  Physical Exam   Last menstrual period 04/14/2014.  Physical Exam Ext:  WNL ABD: Soft, non tender to palpation, no rebound or guarding SVE: unchanged from the last exam 3-4/60/-3   ED Course  Assessment: IUP at  38.2 weeks Membranes: intact FHR: Category 1 CTX:  8-10 minutes   Plan: nst    Rommel Hogston, CNM, MSN 02/12/2015. 3:18 AM

## 2015-02-12 NOTE — Anesthesia Preprocedure Evaluation (Addendum)

## 2015-02-12 NOTE — MAU Note (Signed)
PT  SAYS SHE  STARTED  HURTING  BAD   AT 2230PMM    VE IN OFFICE   3-4  CM.    DENIES HSV AND MRSA.  GBS-  NEG

## 2015-02-12 NOTE — Progress Notes (Signed)
JOZEY JANCO MRN: [id number]  Subjective: -Patient reports rectal pressure during contractions.  Patient also reporting leaking of fluid.  Objective: BP 118/74 mmHg  Pulse 87  Temp(Src) 97.5 F (36.4 C) (Oral)  Resp 20  Ht 5\' 6"  (1.676 m)  Wt 204 lb 2 oz (92.59 kg)  BMI 32.96 kg/m2  SpO2 99%  LMP 04/14/2014     FHT: 115 bpm, Mod Var, -Decels, +Accels UC:  Q1-41min, palpates moderate  SVE:   Dilation: 10 Effacement (%): 100 Station: 0 Exam by:: Letty Salvi CNM Membranes: ?Leaking Pitocin: None  Assessment:  IUP at 38.3wks Cat I FT  2nd Stage Labor  Plan: -Attempted trial pushing with minimal progress -Patient educated on laboring down and to report constant rectal pressure that increases with contractions -Will reassess in 30-45minutes -Continue other mgmt as ordered  Veldon Wager, Danasia LYNN,MSN, CNM 02/12/2015, 11:43 AM

## 2015-02-12 NOTE — Anesthesia Procedure Notes (Addendum)
Epidural Patient location during procedure: OB  Staffing Anesthesiologist: Lyndle Herrlich Performed by: anesthesiologist   Preanesthetic Checklist Completed: patient identified, site marked, surgical consent, pre-op evaluation, timeout performed, IV checked, risks and benefits discussed and monitors and equipment checked  Epidural Patient position: sitting Prep: site prepped and draped and DuraPrep Patient monitoring: continuous pulse ox and blood pressure Approach: midline Location: L3-L4 Injection technique: LOR air  Needle:  Needle type: Tuohy  Needle gauge: 17 G Needle length: 9 cm and 9 Needle insertion depth: 5 cm cm Catheter type: closed end flexible Catheter size: 19 Gauge Catheter at skin depth: 10 cm Test dose: negative  Assessment Events: blood not aspirated, injection not painful, no injection resistance, negative IV test and no paresthesia  Additional Notes Dosing of Epidural:  1st dose, through catheter ............................................Marland Kitchen  Xylocaine 40 mg  2nd dose, through catheter, after waiting 3 minutes........Marland KitchenXylocaine 60 mg    ( 1% Xylo charted as a single dose in Epic Meds for ease of charting; actual dosing was fractionated as above, for saftey's sake)  As each dose occurred, patient was free of IV sx; and patient exhibited no evidence of SA injection.  Patient is more comfortable after epidural dosed. Please see RN's note for documentation of vital signs,and FHR which are stable.  Patient reminded not to try to ambulate with numb legs, and that an RN must be present when she attempts to get up.

## 2015-02-12 NOTE — MAU Note (Signed)
Pt changed cervix and V Standard CNM would like to keep pt and observe for another couple of hours as pt is uncomfortable.

## 2015-02-12 NOTE — Lactation Note (Signed)
This note was copied from the chart of Lauren Solis. Lactation Consultation Note Initial visit at 8 hours of age.  Mom reports a feeding about 2 hours ago and some nipple pain on left breast.  Mom is able to hand express colostrum and encouraged to rub into nipples.  Assisted with latch STS on left breast.  Drops of colostrum expressed to baby's mouth, but baby did not wake for latch.  Baby remains STS with mom. [facility] LC resources given and discussed.  Encouraged to feed with early cues on demand.  Early newborn behavior discussed.  Mom to call for assist as needed.      Patient Name: Lauren Boluwatife Mutchler [id number] Date: 02/12/2015 Reason for consult: Initial assessment   Maternal Data Formula Feeding for Exclusion: No Has patient been taught Hand Expression?: Yes Does the patient have breastfeeding experience prior to this delivery?: Yes  Feeding Feeding Type: Breast Fed Length of feed:  (few drops expressed to baby's mouth)  LATCH Score/Interventions Latch: Too sleepy or reluctant, no latch achieved, no sucking elicited.  Audible Swallowing: None  Type of Nipple: Everted at rest and after stimulation  Comfort (Breast/Nipple): Soft / non-tender     Hold (Positioning): Assistance needed to correctly position infant at breast and maintain latch. Intervention(s): Breastfeeding basics reviewed;Support Pillows;Position options;Skin to skin  LATCH Score: 5  Lactation Tools Discussed/Used     Consult Status Consult Status: Follow-up Date: 02/13/15 Follow-up type: In-patient    Kendell Bane Justine Null 02/12/2015, 9:21 PM

## 2015-02-13 ENCOUNTER — Encounter (HOSPITAL_COMMUNITY): Payer: Self-pay | Admitting: Certified Registered Nurse Anesthetist

## 2015-02-13 ENCOUNTER — Encounter (HOSPITAL_COMMUNITY)
Admission: AD | Disposition: A | Discharge status: Home / Self Care | Payer: Self-pay | Source: Ambulatory Visit | Attending: Obstetrics and Gynecology

## 2015-02-13 ENCOUNTER — Inpatient Hospital Stay (HOSPITAL_COMMUNITY): Payer: Medicaid Other

## 2015-02-13 HISTORY — PX: TUBAL LIGATION: SHX77

## 2015-02-13 HISTORY — PX: BILATERAL SALPINGECTOMY: SHX5743

## 2015-02-13 LAB — CBC
HCT: 28.2 % — ABNORMAL LOW (ref 36.0–46.0)
HEMOGLOBIN: 9.7 g/dL — AB (ref 12.0–15.0)
MCH: 23.3 pg — AB (ref 26.0–34.0)
MCHC: 34.4 g/dL (ref 30.0–36.0)
MCV: 67.6 fL — ABNORMAL LOW (ref 78.0–100.0)
Platelets: 179 10*3/uL (ref 150–400)
RBC: 4.17 MIL/uL (ref 3.87–5.11)
RDW: 17.9 % — AB (ref 11.5–15.5)
WBC: 11 10*3/uL — ABNORMAL HIGH (ref 4.0–10.5)

## 2015-02-13 LAB — SURGICAL PCR SCREEN
MRSA, PCR: NEGATIVE
Staphylococcus aureus: NEGATIVE

## 2015-02-13 SURGERY — LIGATION, FALLOPIAN TUBE, POSTPARTUM
Anesthesia: Epidural | Site: Abdomen | Laterality: Bilateral

## 2015-02-13 MED ORDER — 0.9 % SODIUM CHLORIDE (POUR BTL) OPTIME
TOPICAL | Status: DC | PRN
  Administered 2015-02-13: 1000 mL

## 2015-02-13 MED ORDER — PROMETHAZINE HCL 25 MG/ML IJ SOLN: 6.2500 mg | INTRAMUSCULAR | Status: DC | PRN

## 2015-02-13 MED ORDER — LIDOCAINE-EPINEPHRINE (PF) 2 %-1:200000 IJ SOLN
INTRAMUSCULAR | Status: AC
  Filled 2015-02-13: qty 20

## 2015-02-13 MED ORDER — SODIUM BICARBONATE 8.4 % IV SOLN
INTRAVENOUS | Status: DC | PRN
  Administered 2015-02-13 (×4): 5 mL via EPIDURAL

## 2015-02-13 MED ORDER — LACTATED RINGERS IV SOLN
INTRAVENOUS | Status: DC
  Administered 2015-02-13: 10:00:00 via INTRAVENOUS

## 2015-02-13 MED ORDER — MIDAZOLAM HCL 2 MG/2ML IJ SOLN
INTRAMUSCULAR | Status: AC
  Filled 2015-02-13: qty 2

## 2015-02-13 MED ORDER — METOCLOPRAMIDE HCL 10 MG PO TABS
10.0000 mg | ORAL_TABLET | Freq: Once | ORAL | Status: AC
  Administered 2015-02-13: 10 mg via ORAL
  Filled 2015-02-13: qty 1

## 2015-02-13 MED ORDER — FENTANYL CITRATE 0.05 MG/ML IJ SOLN
INTRAMUSCULAR | Status: AC
  Filled 2015-02-13: qty 2

## 2015-02-13 MED ORDER — LACTATED RINGERS IV SOLN
INTRAVENOUS | Status: DC | PRN
  Administered 2015-02-13 (×2): via INTRAVENOUS

## 2015-02-13 MED ORDER — FENTANYL CITRATE 0.05 MG/ML IJ SOLN: 25.0000 ug | INTRAMUSCULAR | Status: DC | PRN

## 2015-02-13 MED ORDER — MEPERIDINE HCL 25 MG/ML IJ SOLN: 6.2500 mg | INTRAMUSCULAR | Status: DC | PRN

## 2015-02-13 MED ORDER — ACETAMINOPHEN 160 MG/5ML PO SOLN: 325.0000 mg | ORAL | Status: DC | PRN

## 2015-02-13 MED ORDER — FAMOTIDINE 20 MG PO TABS
40.0000 mg | ORAL_TABLET | Freq: Once | ORAL | Status: AC
  Administered 2015-02-13: 40 mg via ORAL
  Filled 2015-02-13: qty 2

## 2015-02-13 MED ORDER — LIDOCAINE-EPINEPHRINE 1 %-1:100000 IJ SOLN
INTRAMUSCULAR | Status: DC | PRN
  Administered 2015-02-13: 8 mL
  Administered 2015-02-13: 9 mL

## 2015-02-13 MED ORDER — MIDAZOLAM HCL 5 MG/5ML IJ SOLN
INTRAMUSCULAR | Status: DC | PRN
  Administered 2015-02-13 (×2): 1 mg via INTRAVENOUS

## 2015-02-13 MED ORDER — FENTANYL CITRATE 0.05 MG/ML IJ SOLN
INTRAMUSCULAR | Status: DC | PRN
  Administered 2015-02-13: 50 ug via EPIDURAL

## 2015-02-13 MED ORDER — MIDAZOLAM HCL 2 MG/2ML IJ SOLN: 0.5000 mg | Freq: Once | INTRAMUSCULAR | Status: DC | PRN

## 2015-02-13 MED ORDER — KETOROLAC TROMETHAMINE 30 MG/ML IJ SOLN: 30.0000 mg | Freq: Once | INTRAMUSCULAR | Status: AC | PRN

## 2015-02-13 MED ORDER — ACETAMINOPHEN 325 MG PO TABS: 325.0000 mg | ORAL_TABLET | ORAL | Status: DC | PRN

## 2015-02-13 SURGICAL SUPPLY — 24 items
CHLORAPREP W/TINT 26ML (MISCELLANEOUS) ×2 IMPLANT
CLOTH BEACON ORANGE TIMEOUT ST (SAFETY) ×2 IMPLANT
CONTAINER PREFILL 10% NBF 15ML (MISCELLANEOUS) ×4 IMPLANT
DRSG OPSITE POSTOP 3X4 (GAUZE/BANDAGES/DRESSINGS) ×2 IMPLANT
ELECT LIGASURE SHORT 9 REUSE (ELECTRODE) ×1 IMPLANT
ELECT REM PT RETURN 9FT ADLT (ELECTROSURGICAL) ×2
ELECTRODE REM PT RTRN 9FT ADLT (ELECTROSURGICAL) IMPLANT
GLOVE BIOGEL PI IND STRL 7.0 (GLOVE) ×1 IMPLANT
GLOVE BIOGEL PI INDICATOR 7.0 (GLOVE) ×1
GLOVE ECLIPSE 6.5 STRL STRAW (GLOVE) ×2 IMPLANT
GOWN STRL REUS W/TWL LRG LVL3 (GOWN DISPOSABLE) ×4 IMPLANT
LIQUID BAND (GAUZE/BANDAGES/DRESSINGS) ×2 IMPLANT
NDL HYPO 25X1 1.5 SAFETY (NEEDLE) ×1 IMPLANT
NEEDLE HYPO 25X1 1.5 SAFETY (NEEDLE) ×2 IMPLANT
NS IRRIG 1000ML POUR BTL (IV SOLUTION) ×2 IMPLANT
PACK ABDOMINAL MINOR (CUSTOM PROCEDURE TRAY) ×2 IMPLANT
PENCIL BUTTON HOLSTER BLD 10FT (ELECTRODE) ×2 IMPLANT
SUT MON AB 4-0 PS1 27 (SUTURE) ×2 IMPLANT
SUT PLAIN 1 NONE 54 (SUTURE) ×2 IMPLANT
SUT VIC AB 0 CT1 27 (SUTURE) ×2
SUT VIC AB 0 CT1 27XBRD ANBCTR (SUTURE) ×1 IMPLANT
SYR CONTROL 10ML LL (SYRINGE) ×2 IMPLANT
TOWEL OR 17X24 6PK STRL BLUE (TOWEL DISPOSABLE) ×4 IMPLANT
TRAY FOLEY CATH 14FR (SET/KITS/TRAYS/PACK) ×2 IMPLANT

## 2015-02-13 NOTE — Anesthesia Postprocedure Evaluation (Signed)
  Anesthesia Post-op Note  Anesthesia Post Note  Patient: Lauren Solis  Procedure(s) Performed: * No procedures listed *  Anesthesia type: Epidural  Patient location: Mother/Baby  Post pain: Pain level controlled  Post assessment: Post-op Vital signs reviewed  Last Vitals:  Filed Vitals:   02/13/15 0350  BP: 124/74  Pulse: 75  Temp: 36.8 C  Resp: 18    Post vital signs: Reviewed  Level of consciousness:alert  Complications: No apparent anesthesia complications

## 2015-02-13 NOTE — Progress Notes (Signed)
Post Partum Day 1 Subjective: no complaints. She has been NPO since midnight as she desires bilateral tubal ligation for permanent sterilization.  Ambulating and voiding without difficulty.    Objective: Blood pressure 111/54, pulse 76, temperature 98.3 F (36.8 C), temperature source Oral, resp. rate 20, height 5\' 6"  (1.676 m), weight 204 lb 2 oz (92.59 kg), last menstrual period 04/14/2014, SpO2 99 %.  Physical Exam:  General: alert, cooperative and no distress  CVS: s1, s2, RRR Pulm: CTAB Lochia: appropriate Uterine Fundus: firm, above umbulicus DVT Evaluation: No evidence of DVT seen on physical exam.  Recent Labs  02/12/15 0715 02/13/15 0535  HGB 11.3* 9.7*  HCT 32.9* 28.2*    Assessment/Plan: Postpartum day 1, stable. We discussed risks, benefits and alternatives of bilateral tubal ligation including risks of bleeding, infection and damage to organs.  We discussed options and risks, benefits and alternatives of bilateral partial salpingectomy versus complete salpingectomy.  Patient desires complete salpingectomy understanding that she will achieve permanent sterilization with the procedure.  She is 100% sure she does not want any more children.  She declines other options of birth control such as temporary devices and medication and female sterilization.   She also desires circumcision of neonate.  We discussed risks, benefits and alternatives of circumcision.  She will sort out insurance issues first.  All questions were answered.       LOS: 1 day   [facility] 02/13/2015, 11:39 AM

## 2015-02-13 NOTE — Procedures (Signed)
  PREOP DIAGNOSIS: Multiparous patient desiring permanent sterilization.  POSTOP DIAGNOSIS: Same as above  PROCEDURE: Post partum bilateral tubal salpingectomy.  SURGEON: Dr. Waymon Amato  ASSISTANT: Scrub tech: Coralyn Mark.  ANESTHESIA: Bolused epidural.  COMPLICATIONS: None  EBL: 20 mL  IV FLUID: 1100 mL LR  URINE OUTPUT: 400 mL  FINDINGS: Normal uterus. Normal fallopian tubes bilaterally.  Normal right ovary. Left ovary not palpable or visualized (patient reports history of left oophorectomy).   PROCEDURE:   Informed consent was obtained from the patient to undergo the procedure after discussing the risks benefits and alternatives of the procedure including risks of tubal regret, bleeding, infection and damage to organs.  She also understood that there are other temporary methods of birth control, and also female sterilization and she did not desire these other options.  She was taken to the operating room where anesthesia was administered without difficulty.  She was prepped abdominally in the usual sterile fashion. Foley catheter was placed in the bladder.    1% lidocaine with epinephrine was instilled in the infraumbilical area. The skin was incised with a scalpel about 5cm, and and entry into the abdomen was made through the subcutaneous layer, fascia and peritoneal layers using Allis clamps, hemostats, retractors and metzenbaum scissors.  The patient was tilted to her left side and the right fallopian tube was identified and followed up to the fimbria end through the incision. The right fallopian tube was then transected from the mesosalpinx using the ligasure.  The patient was then tilted to her right side and the left fallopian tube identified, followed out to the fimbria end and transected with the ligasure.  Excellent hemostasis was not added. The fascia was then closed using 0 Vicryl in a running stitch.  The skin was closed using 4-0 Monocryl subcuticular stitch. More 1% lidocaine with  epi was instilled over the incision, 10cc more.  Dermabond and Hone comb dressing were placed. The instrument and needle counts were correct. She was taken to the operating room in a stable condition.  SPECIMEN:  Left and right fallopian tubes

## 2015-02-13 NOTE — Transfer of Care (Signed)
Immediate Anesthesia Transfer of Care Note  Patient: Lauren Solis  Procedure(s) Performed: Procedure(s): POST PARTUM TUBAL LIGATION (Bilateral) BILATERAL SALPINGECTOMY (Bilateral)  Patient Location: PACU  Anesthesia Type:Epidural  Level of Consciousness: awake, alert , oriented and patient cooperative  Airway & Oxygen Therapy: Patient Spontanous Breathing and Patient connected to nasal cannula oxygen  Post-op Assessment: Report given to RN and Post -op Vital signs reviewed and stable  Post vital signs: Reviewed and stable  Last Vitals:  Filed Vitals:   02/13/15 1100  BP: 111/54  Pulse: 76  Temp: 36.8 C  Resp: 20    Complications: No apparent anesthesia complications

## 2015-02-13 NOTE — Anesthesia Postprocedure Evaluation (Signed)
  Anesthesia Post Note  Patient: Lauren Solis  Procedure(s) Performed: Procedure(s) (LRB): POST PARTUM TUBAL LIGATION (Bilateral) BILATERAL SALPINGECTOMY (Bilateral)  Anesthesia type: Epidural  Patient location: PACU  Post pain: Pain level controlled  Post assessment: Post-op Vital signs reviewed  Last Vitals:  Filed Vitals:   02/13/15 1430  BP: 108/59  Pulse: 79  Temp:   Resp: 21    Post vital signs: Reviewed  Level of consciousness: awake  Complications: No apparent anesthesia complications

## 2015-02-13 NOTE — Anesthesia Preprocedure Evaluation (Signed)
Anesthesia Evaluation  Patient identified by MRN, date of birth, ID band Patient awake    Reviewed: Allergy & Precautions, H&P , Patient's Chart, lab work & pertinent test results  Airway Mallampati: II  TM Distance: >3 FB Neck ROM: full    Dental  (+) Teeth Intact   Pulmonary  breath sounds clear to auscultation        Cardiovascular Rhythm:regular Rate:Normal     Neuro/Psych    GI/Hepatic   Endo/Other    Renal/GU      Musculoskeletal   Abdominal   Peds  Hematology   Anesthesia Other Findings       Reproductive/Obstetrics                             Anesthesia Physical  Anesthesia Plan  ASA: II  Anesthesia Plan: Epidural   Post-op Pain Management:    Induction:   Airway Management Planned:   Additional Equipment:   Intra-op Plan:   Post-operative Plan:   Informed Consent: I have reviewed the patients History and Physical, chart, labs and discussed the procedure including the risks, benefits and alternatives for the proposed anesthesia with the patient or authorized representative who has indicated his/her understanding and acceptance.   Dental Advisory Given  Plan Discussed with:   Anesthesia Plan Comments: (Labs checked- platelets confirmed with RN in room. Fetal heart tracing, per RN, reported to be stable enough for sitting procedure. Discussed epidural, and patient consents to the procedure:  included risk of possible headache,backache, failed block, allergic reaction, and nerve injury. This patient was asked if she had any questions or concerns before the procedure started.)        Anesthesia Quick Evaluation

## 2015-02-13 NOTE — Progress Notes (Signed)
UR chart review completed.  

## 2015-02-14 ENCOUNTER — Encounter (HOSPITAL_COMMUNITY): Payer: Self-pay | Admitting: Obstetrics & Gynecology

## 2015-02-14 DIAGNOSIS — I8393 Asymptomatic varicose veins of bilateral lower extremities: Secondary | ICD-10-CM

## 2015-02-14 DIAGNOSIS — Z9851 Tubal ligation status: Secondary | ICD-10-CM

## 2015-02-14 DIAGNOSIS — M79604 Pain in right leg: Secondary | ICD-10-CM

## 2015-02-14 MED ORDER — IBUPROFEN 600 MG PO TABS: 600.0000 mg | ORAL_TABLET | Freq: Four times a day (QID) | ORAL | Status: DC | PRN

## 2015-02-14 MED ORDER — OXYCODONE-ACETAMINOPHEN 5-325 MG PO TABS: 1.0000 | ORAL_TABLET | Freq: Four times a day (QID) | ORAL | Status: DC | PRN

## 2015-02-14 NOTE — Discharge Instructions (Signed)
Iron-Rich Diet °An iron-rich diet contains foods that are good sources of iron. Iron is an important mineral that helps your body produce hemoglobin. Hemoglobin is a protein in red blood cells that carries oxygen to the body's tissues. Sometimes, the iron level in your blood can be low. This may be caused by: °· A lack of iron in your diet. °· Blood loss. °· Times of growth, such as during pregnancy or during a child's growth and development. °Low levels of iron can cause a decrease in the number of red blood cells. This can result in iron deficiency anemia. Iron deficiency anemia symptoms include: °· Tiredness. °· Weakness. °· Irritability. °· Increased chance of infection. °Here are some recommendations for daily iron intake: °· Males older than 30 years of age need 8 mg of iron per day. °· Women ages 19 to 50 need 18 mg of iron per day. °· Pregnant women need 27 mg of iron per day, and women who are over 19 years of age and breastfeeding need 9 mg of iron per day. °· Women over the age of 50 need 8 mg of iron per day. °SOURCES OF IRON °There are 2 types of iron that are found in food: heme iron and nonheme iron. Heme iron is absorbed by the body better than nonheme iron. Heme iron is found in meat, poultry, and fish. Nonheme iron is found in grains, beans, and vegetables. °Heme Iron Sources °Food / Iron (mg) °· Chicken liver, 3 oz (85 g)/ 10 mg °· Beef liver, 3 oz (85 g)/ 5.5 mg °· Oysters, 3 oz (85 g)/ 8 mg °· Beef, 3 oz (85 g)/ 2 to 3 mg °· Shrimp, 3 oz (85 g)/ 2.8 mg °· Turkey, 3 oz (85 g)/ 2 mg °· Chicken, 3 oz (85 g) / 1 mg °· Fish (tuna, halibut), 3 oz (85 g)/ 1 mg °· Pork, 3 oz (85 g)/ 0.9 mg °Nonheme Iron Sources °Food / Iron (mg) °· Ready-to-eat breakfast cereal, iron-fortified / 3.9 to 7 mg °· Tofu, ½ cup / 3.4 mg °· Kidney beans, ½ cup / 2.6 mg °· Baked potato with skin / 2.7 mg °· Asparagus, ½ cup / 2.2 mg °· Avocado / 2 mg °· Dried peaches, ½ cup / 1.6 mg °· Raisins, ½ cup / 1.5 mg °· Soy milk, 1 cup  / 1.5 mg °· Whole-wheat bread, 1 slice / 1.2 mg °· Spinach, 1 cup / 0.8 mg °· Broccoli, ½ cup / 0.6 mg °IRON ABSORPTION °Certain foods can decrease the body's absorption of iron. Try to avoid these foods and beverages while eating meals with iron-containing foods: °· Coffee. °· Tea. °· Fiber. °· Soy. °Foods containing vitamin C can help increase the amount of iron your body absorbs from iron sources, especially from nonheme sources. Eat foods with vitamin C along with iron-containing foods to increase your iron absorption. Foods that are high in vitamin C include many fruits and vegetables. Some good sources are: °· Fresh orange juice. °· Oranges. °· Strawberries. °· Mangoes. °· Grapefruit. °· Red bell peppers. °· Green bell peppers. °· Broccoli. °· Potatoes with skin. °· Tomato juice. °Document Released: 07/22/2005 Document Revised: 03/01/2012 Document Reviewed: 05/29/2011 °ExitCare® Patient Information ©2015 [employer]. This information is not intended to replace advice given to you by your health care provider. Make sure you discuss any questions you have with your health care provider. °Postpartum Depression and Baby Blues °The postpartum period begins right after the birth of a baby.   During this time, there is often a great amount of joy and excitement. It is also a time of many changes in the life of the parents. Regardless of how many times a mother gives birth, each child brings new challenges and dynamics to the family. It is not unusual to have feelings of excitement along with confusing shifts in moods, emotions, and thoughts. All mothers are at risk of developing postpartum depression or the "baby blues." These mood changes can occur right after giving birth, or they may occur many months after giving birth. The baby blues or postpartum depression can be mild or severe. Additionally, postpartum depression can go away rather quickly, or it can be a long-term condition.  CAUSES Raised hormone levels  and the rapid drop in those levels are thought to be a main cause of postpartum depression and the baby blues. A number of hormones change during and after pregnancy. Estrogen and progesterone usually decrease right after the delivery of your baby. The levels of thyroid hormone and various cortisol steroids also rapidly drop. Other factors that play a role in these mood changes include major life events and genetics.  RISK FACTORS If you have any of the following risks for the baby blues or postpartum depression, know what symptoms to watch out for during the postpartum period. Risk factors that may increase the likelihood of getting the baby blues or postpartum depression include:  Having a personal or family history of depression.   Having depression while being pregnant.   Having premenstrual mood issues or mood issues related to oral contraceptives.  Having a lot of life stress.   Having marital conflict.   Lacking a social support network.   Having a baby with special needs.   Having health problems, such as diabetes.  SIGNS AND SYMPTOMS Symptoms of baby blues include:  Brief changes in mood, such as going from extreme happiness to sadness.  Decreased concentration.   Difficulty sleeping.   Crying spells, tearfulness.   Irritability.   Anxiety.  Symptoms of postpartum depression typically begin within the first month after giving birth. These symptoms include:  Difficulty sleeping or excessive sleepiness.   Marked weight loss.   Agitation.   Feelings of worthlessness.   Lack of interest in activity or food.  Postpartum psychosis is a very serious condition and can be dangerous. Fortunately, it is rare. Displaying any of the following symptoms is cause for immediate medical attention. Symptoms of postpartum psychosis include:   Hallucinations and delusions.   Bizarre or disorganized behavior.   Confusion or disorientation.  DIAGNOSIS  A  diagnosis is made by an evaluation of your symptoms. There are no medical or lab tests that lead to a diagnosis, but there are various questionnaires that a health care provider may use to identify those with the baby blues, postpartum depression, or psychosis. Often, a screening tool called the [place] Postnatal Depression Scale is used to diagnose depression in the postpartum period.  TREATMENT The baby blues usually goes away on its own in 1-2 weeks. Social support is often all that is needed. You will be encouraged to get adequate sleep and rest. Occasionally, you may be given medicines to help you sleep.  Postpartum depression requires treatment because it can last several months or longer if it is not treated. Treatment may include individual or group therapy, medicine, or both to address any social, physiological, and psychological factors that may play a role in the depression. Regular exercise, a healthy diet, rest,  and social support may also be strongly recommended.  Postpartum psychosis is more serious and needs treatment right away. Hospitalization is often needed. HOME CARE INSTRUCTIONS  Get as much rest as you can. Nap when the baby sleeps.   Exercise regularly. Some women find yoga and walking to be beneficial.   Eat a balanced and nourishing diet.   Do little things that you enjoy. Have a cup of tea, take a bubble bath, read your favorite magazine, or listen to your favorite music.  Avoid alcohol.   Ask for help with household chores, cooking, grocery shopping, or running errands as needed. Do not try to do everything.   Talk to people close to you about how you are feeling. Get support from your partner, family members, friends, or other new moms.  Try to stay positive in how you think. Think about the things you are grateful for.   Do not spend a lot of time alone.   Only take over-the-counter or prescription medicine as directed by your health care  provider.  Keep all your postpartum appointments.   Let your health care provider know if you have any concerns.  SEEK MEDICAL CARE IF: You are having a reaction to or problems with your medicine. SEEK IMMEDIATE MEDICAL CARE IF:  You have suicidal feelings.   You think you may harm the baby or someone else. MAKE SURE YOU:  Understand these instructions.  Will watch your condition.  Will get help right away if you are not doing well or get worse. Document Released: 09/11/2004 Document Revised: 12/13/2013 Document Reviewed: 09/19/2013 [facility] Patient Information 2015 [place], [place]. This information is not intended to replace advice given to you by your health care provider. Make sure you discuss any questions you have with your health care provider.

## 2015-02-14 NOTE — Anesthesia Postprocedure Evaluation (Signed)
Anesthesia Post Note  Patient: Lauren Solis  Procedure(s) Performed: * No procedures listed *  Anesthesia type: Epidural  Patient location: Mother/Baby  Post pain: Pain level controlled  Post assessment: Post-op Vital signs reviewed  Last Vitals:  Filed Vitals:   02/14/15 0629  BP: 127/82  Pulse: 73  Temp: 36.8 C  Resp: 20    Post vital signs: Reviewed  Level of consciousness:alert  Complications: No apparent anesthesia complications

## 2015-02-14 NOTE — Discharge Summary (Signed)
  Vaginal Delivery Discharge Summary  ITALY WARRINER  DOB:    1986/11/03 MRN:    [id number] CSN:    [id number]  Date of admission:                  Feb 12, 2015  Date of discharge:                   Feb 14, 2015  Procedures this admission:   SVD with Clitoral Laceration and BTL  Date of Delivery: Feb 12, 2015  Newborn Data:  Live born female  Birth Weight: 7 lb 9.5 oz (3445 g) APGAR: 9, 9  Home with mother. Name: Lauren Solis Circumcision Plan: Completed Inpt  History of Present Illness:  Ms. Lauren Solis is a 29 y.o. female, G2P1001, who presents at [redacted]w[redacted]d weeks gestation. The patient has been followed at the [facility] division of [facility]. She was admitted rupture of membranes. Her pregnancy has been complicated by:  Patient Active Problem List   Diagnosis Date Noted  . Status post tubal ligation 02/14/2015  . Normal labor 02/12/2015  . SVD (spontaneous vaginal delivery) 02/12/2015  . Central laceration during delivery 02/12/2015  . 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 Leg pain, inferior 12/28/2014     Hospital Course:  Admitted for SROM. Negative GBS. Progressed expectantly. Utilized epidural for pain management.  Delivery was performed by J.Arrionna Serena, CNM without complication. Patient and baby tolerated the procedure without difficulty, with  clitoral laceration noted. Infant status was stable and remained in room with mother.  Mother and infant then had an uncomplicated postpartum course, with breast feeding going well. Mom's physical exam was WNL, and she was discharged home in stable condition. Contraception plan was BTL which was completed on PPD 1.  She received adequate benefit from po pain medications.   Feeding:  breast  Contraception:  bilateral tubal ligation  Discharge hemoglobin:  HEMOGLOBIN  Date Value Ref Range Status  02/13/2015 9.7* 12.0 - 15.0 g/dL Final   HCT  Date Value Ref Range Status   02/13/2015 28.2* 36.0 - 46.0 % Final    Discharge Physical Exam:   General: alert, cooperative and no distress Lochia: appropriate Uterine Fundus: firm Incision: honeycomb dressing in place DVT Evaluation: No significant calf/ankle edema. Posterior calf tenderness present.  Intrapartum Procedures: spontaneous vaginal delivery and tubal ligation Postpartum Procedures: none Complications-Operative and Postpartum: clitoral laceration  Discharge Diagnoses: Term Pregnancy-delivered and S/P BTL  Discharge Information:  Activity:           pelvic rest Diet:                routine Medications: PNV, Ibuprofen and Percocet Condition:      stable Instructions:  Pain Management, Peri-Care, Breastfeeding, Who and When to call for postpartum complications. Information Sheet(s) given Iron rich Diet, PPD/BB    Discharge to: home  Follow-up Information    Follow up with [facility]. Schedule an appointment as soon as possible for a visit in 6 weeks.   Specialty:  Obstetrics and Gynecology   Why:  Please call if you have any questions or concerns prior to your next visit.  Remove Honeycomb dressing in 1-2days   Contact information:   [address] [place] [address] [phone]       ACHAIA, GARLOCK, MSN, CNM 02/14/2015 2:34 PM

## 2015-02-14 NOTE — Progress Notes (Addendum)
Lauren Solis  Post Partum Day 2:S/P SVD with Clitoral Laceration  Subjective: Patient up ad lib, denies syncope or dizziness. Reports consuming regular diet without issues and denies N/V. Denies issues with urination and reports bleeding is "good."  Patient is breastfeeding.  BTL has been completed for postpartum contraception.  Pain is being appropriately managed with use of ibuprofen and percocet.  Patient does report right leg pain in calf and behind knee.    Objective: Filed Vitals:   02/13/15 2226 02/14/15 0215 02/14/15 0619 02/14/15 0629  BP: 110/60 111/64 118/71 127/82  Pulse: 85 61 67 73  Temp: 98.7 F (37.1 C) 98.7 F (37.1 C) 98.3 F (36.8 C) 98.2 F (36.8 C)  TempSrc: Oral Oral Oral Oral  Resp: 20 18 18 20   Height:      Weight:      SpO2: 99% 99% 98%     Recent Labs  02/12/15 0715 02/13/15 0535  HGB 11.3* 9.7*  HCT 32.9* 28.2*    Physical Exam:  General: alert, cooperative and no distress Mood/Affect: Appropriate/Bright Lungs: clear to auscultation, no wheezes, rales or rhonchi, symmetric air entry.  Heart: normal rate and regular rhythm. Abdomen:  + bowel sounds, Soft, Appropriately tender, Pressure Dressing Removed, Honeycomb in place Uterine Fundus: firm at U/-2 Lochia: appropriate Laceration: Healing well, per patient report Skin: Warm, Dry--Purplish-red bruising noted behind and laterally to right knee cap. Warm to touch, some mild swelling. DVT Evaluation: No significant calf/ankle edema. Posterior calf tenderness present.  Assessment S/P Vaginal Delivery-Day Normal Involution BreastFeeding Leg Pain-DVT vs Injury  Plan: Discussed possibility of DVT or injury to leg while numb from epidural Doppler of right leg, if normal discharge, if abnormal will follow up accordingly Continue current care Dr. Carmon Sails to be updated on patient status   [name], [name], MSN, CNM 02/14/2015, 9:29 AM    I saw and examined patient at bedside and agree  with above findings assessment and plan. Right leg with 2+ edema, with superficial erythematous linear induration on medial aspect of upper right knee. This area is slightly tender to touch. No warmth or erythema over the deeper layer tissues. No erythema or warmth over right calf.  Right lower extremity ultrasound Dopplers was negative for DVT. I discussed with patient likely with varicose veins versus superficial thrombophlebitis versus trauma to the area. I advised warm compress use over area, compression stockings and close monitoring of symptoms. If symptoms worsen patient was advised to call or return for another evaluation. Reviewed deep vein thrombosis precautions.  Umbilical dressing with small dried blood.  I advised her to remove umbilical dressing within a week. Dr. Alesia Richards.

## 2015-02-14 NOTE — Progress Notes (Signed)
VASCULAR LAB PRELIMINARY  PRELIMINARY  PRELIMINARY  PRELIMINARY  Right lower extremity venous duplex completed.    Preliminary report:  Right:  No evidence of DVT, superficial thrombosis, or Baker's cyst.  Lauren Solis, RVS 02/14/2015, 1:23 PM

## 2015-02-17 ENCOUNTER — Inpatient Hospital Stay (HOSPITAL_COMMUNITY)
Admission: AD | Admit: 2015-02-17 | Discharge: 2015-02-17 | Disposition: A | Discharge status: Home / Self Care | Payer: Medicaid Other | Source: Ambulatory Visit | Attending: Obstetrics and Gynecology | Admitting: Obstetrics and Gynecology

## 2015-02-17 ENCOUNTER — Inpatient Hospital Stay (HOSPITAL_COMMUNITY): Payer: Medicaid Other

## 2015-02-17 ENCOUNTER — Encounter (HOSPITAL_COMMUNITY): Payer: Self-pay

## 2015-02-17 DIAGNOSIS — I8393 Asymptomatic varicose veins of bilateral lower extremities: Secondary | ICD-10-CM

## 2015-02-17 DIAGNOSIS — R079 Chest pain, unspecified: Secondary | ICD-10-CM | POA: Diagnosis not present

## 2015-02-17 DIAGNOSIS — R030 Elevated blood-pressure reading, without diagnosis of hypertension: Secondary | ICD-10-CM | POA: Diagnosis not present

## 2015-02-17 DIAGNOSIS — O874 Varicose veins of lower extremity in the puerperium: Secondary | ICD-10-CM | POA: Insufficient documentation

## 2015-02-17 DIAGNOSIS — R0602 Shortness of breath: Secondary | ICD-10-CM

## 2015-02-17 DIAGNOSIS — I8390 Asymptomatic varicose veins of unspecified lower extremity: Secondary | ICD-10-CM

## 2015-02-17 DIAGNOSIS — M79606 Pain in leg, unspecified: Secondary | ICD-10-CM | POA: Diagnosis present

## 2015-02-17 DIAGNOSIS — O9089 Other complications of the puerperium, not elsewhere classified: Secondary | ICD-10-CM | POA: Insufficient documentation

## 2015-02-17 DIAGNOSIS — M79604 Pain in right leg: Secondary | ICD-10-CM

## 2015-02-17 DIAGNOSIS — I8001 Phlebitis and thrombophlebitis of superficial vessels of right lower extremity: Secondary | ICD-10-CM

## 2015-02-17 LAB — COMPREHENSIVE METABOLIC PANEL
ALT: 33 U/L (ref 0–35)
ANION GAP: 2 — AB (ref 5–15)
AST: 30 U/L (ref 0–37)
Albumin: 2.8 g/dL — ABNORMAL LOW (ref 3.5–5.2)
Alkaline Phosphatase: 116 U/L (ref 39–117)
BILIRUBIN TOTAL: 0.8 mg/dL (ref 0.3–1.2)
BUN: 15 mg/dL (ref 6–23)
CHLORIDE: 111 mmol/L (ref 96–112)
CO2: 25 mmol/L (ref 19–32)
Calcium: 8.6 mg/dL (ref 8.4–10.5)
Creatinine, Ser: 0.71 mg/dL (ref 0.50–1.10)
GFR calc Af Amer: >90 mL/min (ref >90)
GFR calc non Af Amer: >90 mL/min (ref >90)
GLUCOSE: 83 mg/dL (ref 70–99)
Potassium: 4.1 mmol/L (ref 3.5–5.1)
SODIUM: 138 mmol/L (ref 135–145)
Total Protein: 6.5 g/dL (ref 6.0–8.3)

## 2015-02-17 LAB — URIC ACID: URIC ACID, SERUM: 7.3 mg/dL — AB (ref 2.4–7.0)

## 2015-02-17 LAB — PROTIME-INR
INR: 1.02 (ref 0.00–1.49)
Prothrombin Time: 13.5 seconds (ref 11.6–15.2)

## 2015-02-17 LAB — CBC
HCT: 30.8 % — ABNORMAL LOW (ref 36.0–46.0)
Hemoglobin: 10.5 g/dL — ABNORMAL LOW (ref 12.0–15.0)
MCH: 23.1 pg — ABNORMAL LOW (ref 26.0–34.0)
MCHC: 34.1 g/dL (ref 30.0–36.0)
MCV: 67.8 fL — ABNORMAL LOW (ref 78.0–100.0)
Platelets: 216 10*3/uL (ref 150–400)
RBC: 4.54 MIL/uL (ref 3.87–5.11)
RDW: 18.2 % — AB (ref 11.5–15.5)
WBC: 5.3 10*3/uL (ref 4.0–10.5)

## 2015-02-17 LAB — LACTATE DEHYDROGENASE: LDH: 204 U/L (ref 94–250)

## 2015-02-17 LAB — APTT: aPTT: 28 seconds (ref 24–37)

## 2015-02-17 MED ORDER — IOHEXOL 350 MG/ML SOLN
100.0000 mL | Freq: Once | INTRAVENOUS | Status: AC | PRN
  Administered 2015-02-17: 100 mL via INTRAVENOUS

## 2015-02-17 MED ORDER — LABETALOL HCL 5 MG/ML IV SOLN: 10.0000 mg | INTRAVENOUS | Status: DC | PRN

## 2015-02-17 MED ORDER — ENOXAPARIN SODIUM 40 MG/0.4ML ~~LOC~~ SOLN: 90.0000 mg | Freq: Two times a day (BID) | SUBCUTANEOUS | Status: DC

## 2015-02-17 NOTE — Progress Notes (Signed)
VASCULAR LAB PRELIMINARY  PRELIMINARY  PRELIMINARY  PRELIMINARY  Right lower extremity venous Doppler completed.    Preliminary report:  There is no DVT noted in the right lower extremity.  There is thrombosis noted in a varicosity in the proximal thigh extending to just above the knee.    Cheron Coryell, RVT 02/17/2015, 5:32 PM

## 2015-02-17 NOTE — Consult Note (Addendum)
[facility] HISTORY AND PHYSICAL  Requesting: Dr Thurnell Lose Reason for consult: superficial thrombophlebitis History of Present Illness:  Patient is a 29 y.o. year old female who presents for evaluation of painful swollen area right medial knee.  The patient is 6 days post partum.  She began to develop swollen legs with some varicosities during her pregnancy.  She did wear compression stockings during this period.  Over the last 48 hrs developed a warm painful firm mass in the right knee area.  This was shown to be a thrombosed varicosity on [place] today. She has had some swelling of her legs left > right. The patient does have a family history of superficial venous thrombosis as well as pulmonary embolus in her father.  She has had some shortness of breath today and CTA of chest is pending.  No hemoptysis.  No prior similar episodes.  No fever or chills.  No recent instrumentation of veins in her legs.  Past Medical History  Diagnosis Date  . Medical history non-contributory     Past Surgical History  Procedure Laterality Date  . Oophorectomy  2010  . Tubal ligation Bilateral 02/13/2015    Procedure: POST PARTUM TUBAL LIGATION;  Surgeon: Alinda Dooms, MD;  Location: [facility];  Service: Gynecology;  Laterality: Bilateral;  . Bilateral salpingectomy Bilateral 02/13/2015    Procedure: BILATERAL SALPINGECTOMY;  Surgeon: Alinda Dooms, MD;  Location: [facility];  Service: Gynecology;  Laterality: Bilateral;    Social History History  Substance Use Topics  . Smoking status: Never Smoker   . Smokeless tobacco: Not on file  . Alcohol Use: No    Family History Family History  Problem Relation Age of Onset  . Cancer Maternal Aunt     Breast  . Diabetes Maternal Aunt   . Diabetes Maternal Uncle   . Kidney disease Maternal Grandmother     Allergies  No Known Allergies   Current Facility-Administered Medications  Medication Dose Route Frequency Provider  Last Rate Last Dose  . labetalol (NORMODYNE,TRANDATE) injection 10-40 mg  10-40 mg Intravenous Q10 min PRN Gavin Pound, CNM        ROS:   General:  No weight loss, Fever, chills  Vascular: No history of rest pain in feet.  No history of claudication.  No history of non-healing ulcer, No history of DVT   Pulmonary: No home oxygen, no productive cough, no hemoptysis,  No asthma or wheezing  Hematologic:No history of hypercoagulable state.  No history of easy bleeding.      Physical Examination  Filed Vitals:   02/17/15 1530 02/17/15 1545 02/17/15 1600 02/17/15 1615  BP: 139/92 135/92 138/97 123/87  Pulse: 91 87 86 86  Temp:      TempSrc:      Resp:        There is no weight on file to calculate BMI.  General:  Alert and oriented, no acute distress HEENT: Normal Neck: No JVD Pulmonary: Symmetric chest excursion no work of breathing Cardiac: Regular Rate and Rhythm Abdomen: Soft, non-tender Skin: No rash, 5 cm area right medial malleolus that looks like early hemosiderin staining, 4 cm area of erythema, right medial knee, palpable cord tender to palpation Extremity Pulses:  2+ radial, brachial, femoral, dorsalis pedis,pulses bilaterally Musculoskeletal: No deformity trace edema left slightly greater than right Neurologic: Upper and lower extremity motor 5/5 and symmetric  DATA:  Venous duplex no DVT, thrombosed varicosity in area of tenderness  ASSESSMENT:  Superficial thrombophlebitis right leg most likely associated with stasis and hypercoagulable state of pregnancy but also does have family history   PLAN:  1.  Recommend weight based Lovenox injections for one month.  1 mg/kg q 12h  2. Warm compresses to relieve pain  3.  NSAIDs to reduce inflammation and control pain.  4.  She can follow up in our office in 1 month at which time we will repeat her [place].  CTA pending.  If she has evidence of PE.  Her anticoagulation will need to be extended to 6 months.  I left a  message with our office to call her with appt.   Our office number is [phone] if she has questions or problems.  Ruta Hinds, MD [facility] Office: [phone] Pager: [phone]

## 2015-02-17 NOTE — MAU Provider Note (Signed)
History    Lauren Solis is a 29 y.o. G2P2002 who is 5 days PP and presents, after phone call, for leg pain.  Patient reports site of recent bruise has now become hard mass that causes pain with ambulation and is tender to touch.  Patient also reports an incident of SOB that occured last night when laying down.  Patient reports history of varicose veins and had doppler studies prior to [facility] discharge.  Patient denies other issues at current.    Patient Active Problem List   Diagnosis Date Noted  . Status post tubal ligation 02/14/2015  . Normal labor 02/12/2015  . SVD (spontaneous vaginal delivery) 02/12/2015  . Central laceration during delivery 02/12/2015  . 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 Leg pain, inferior 12/28/2014    Chief Complaint  Patient presents with  . Leg Swelling  . Ankle Pain   HPI  OB History    Gravida Para Term Preterm AB TAB SAB Ectopic Multiple Living   2 2 2  0 0 0 0 0 0 1      Past Medical History  Diagnosis Date  . Medical history non-contributory     Past Surgical History  Procedure Laterality Date  . Oophorectomy  2010  . Tubal ligation Bilateral 02/13/2015    Procedure: POST PARTUM TUBAL LIGATION;  Surgeon: Alinda Dooms, MD;  Location: [facility];  Service: Gynecology;  Laterality: Bilateral;  . Bilateral salpingectomy Bilateral 02/13/2015    Procedure: BILATERAL SALPINGECTOMY;  Surgeon: Alinda Dooms, MD;  Location: [facility];  Service: Gynecology;  Laterality: Bilateral;    Family History  Problem Relation Age of Onset  . Cancer Maternal Aunt     Breast  . Diabetes Maternal Aunt   . Diabetes Maternal Uncle   . Kidney disease Maternal Grandmother     History  Substance Use Topics  . Smoking status: Never Smoker   . Smokeless tobacco: Not on file  . Alcohol Use: No    Allergies: No Known Allergies  Prescriptions prior to admission  Medication Sig Dispense Refill Last Dose  . ibuprofen (ADVIL,MOTRIN) 600 MG tablet Take 1  tablet (600 mg total) by mouth every 6 (six) hours as needed. 30 tablet 2   . oxyCODONE-acetaminophen (PERCOCET/ROXICET) 5-325 MG per tablet Take 1-2 tablets by mouth every 6 (six) hours as needed (for pain scale equal to or greater than 7). 45 tablet 0     ROS  See HPI Above Physical Exam   Blood pressure 142/96, pulse 91, temperature 98 F (36.7 C), temperature source Oral, resp. rate 18, last menstrual period 04/14/2014, unknown if currently breastfeeding.  Results for orders placed or performed during the hospital encounter of 02/17/15 (from the past 24 hour(s))  Uric acid     Status: Abnormal   Collection Time: 02/17/15  4:13 PM  Result Value Ref Range   Uric Acid, Serum 7.3 (H) 2.4 - 7.0 mg/dL  Lactate dehydrogenase     Status: None   Collection Time: 02/17/15  4:13 PM  Result Value Ref Range   LDH 204 94 - 250 U/L  Comprehensive metabolic panel     Status: Abnormal   Collection Time: 02/17/15  4:13 PM  Result Value Ref Range   Sodium 138 135 - 145 mmol/L   Potassium 4.1 3.5 - 5.1 mmol/L   Chloride 111 96 - 112 mmol/L   CO2 25 19 - 32 mmol/L   Glucose, Bld 83 70 - 99 mg/dL   BUN  15 6 - 23 mg/dL   Creatinine, Ser 0.71 0.50 - 1.10 mg/dL   Calcium 8.6 8.4 - 10.5 mg/dL   Total Protein 6.5 6.0 - 8.3 g/dL   Albumin 2.8 (L) 3.5 - 5.2 g/dL   AST 30 0 - 37 U/L   ALT 33 0 - 35 U/L   Alkaline Phosphatase 116 39 - 117 U/L   Total Bilirubin 0.8 0.3 - 1.2 mg/dL   GFR calc non Af Amer >90 >90 mL/min   GFR calc Af Amer >90 >90 mL/min   Anion gap 2 (L) 5 - 15  CBC     Status: Abnormal   Collection Time: 02/17/15  4:13 PM  Result Value Ref Range   WBC 5.3 4.0 - 10.5 K/uL   RBC 4.54 3.87 - 5.11 MIL/uL   Hemoglobin 10.5 (L) 12.0 - 15.0 g/dL   HCT 30.8 (L) 36.0 - 46.0 %   MCV 67.8 (L) 78.0 - 100.0 fL   MCH 23.1 (L) 26.0 - 34.0 pg   MCHC 34.1 30.0 - 36.0 g/dL   RDW 18.2 (H) 11.5 - 15.5 %   Platelets 216 150 - 400 K/uL  APTT     Status: None   Collection Time: 02/17/15  5:10 PM   Result Value Ref Range   aPTT 28 24 - 37 seconds  Protime-INR     Status: None   Collection Time: 02/17/15  5:10 PM  Result Value Ref Range   Prothrombin Time 13.5 11.6 - 15.2 seconds   INR 1.02 0.00 - 1.49     Physical Exam  Constitutional: She is oriented to person, place, and time. She appears well-developed and well-nourished. No distress.  HENT:  Head: Normocephalic and atraumatic.  Eyes: EOM are normal.  Neck: Normal range of motion.  Cardiovascular: Normal rate, regular rhythm and normal heart sounds.   Respiratory: Effort normal and breath sounds normal.  GI: Soft. Bowel sounds are normal.  Musculoskeletal: Normal range of motion.  Neurological: She is alert and oriented to person, place, and time.  Skin: Skin is dry and intact. Rash (.dark purplish plaque on right ankle with small red wheals on foot) noted. Rash is nodular (.palpated laterally to knee cap.  Skin hot, red, and edematous).   CT Angio Chest PE w/Contrast FINDINGS: No evidence of pulmonary embolism.  Mediastinum/Nodes: Heart is normal in size. No pericardial effusion.  Triangular soft tissue in the anterior mediastinum corresponds to probable normal thymus (series 4/image 59).  No suspicious mediastinal, hilar, or axillary lymphadenopathy.  Visualized thyroid is unremarkable.  Lungs/Pleura: Mild patchy opacity in the lateral right middle lobe and at the left lung base (series 6/images 116 and 118), likely reflecting atelectasis. No focal consolidation.  No suspicious pulmonary nodules.  No pleural effusion or pneumothorax.  Upper abdomen: Visualized upper abdomen is unremarkable.  Musculoskeletal: Visualized osseous structures are within normal limits.  Review of the MIP images confirms the above findings.  IMPRESSION: No evidence of pulmonary embolism.  Mild dependent atelectasis at the left lung base.  No evidence of acute cardiopulmonary disease. ED Course   Assessment: 5 Days PP Edema of Leg Elevated BP  Plan: -PE as above -Labs: CBC, CMP, UA, LDH -Doppler of Right Extremity -Dr. Florene Route consulted and advised as below: -Add on aPtt, INR -Will consult with vascular team -Will come to assess patient  Follow Up (1852) -Dr. Florene Route in to assess -Reports vascular team has diagnosed and informed patient of necessary treatment -Patient c/o  SOB; Spiral CT ordered  Follow Up (2127) -CT Scan negative -Dr. Pershing Proud of Vascular Team assessed and wrote scripts for Lovenox -Patient to follow up with vascular team -Educated regarding follow up, lovenox use, and who and when to call for concerns -Discharged to home in stable condition  Briona Korpela, Rachael LYNN CNM, MSN 02/17/2015 3:37 PM

## 2015-02-17 NOTE — MAU Note (Signed)
Pt presents complaining of increased swelling and ankle pain. Also has a spot on the inside of her left leg that is red, swollen and warm. States it is very tender to the touch.

## 2015-02-17 NOTE — Discharge Instructions (Signed)
Phlebitis Phlebitis is soreness and swelling (inflammation) of a vein. This can occur in your arms, legs, or torso (trunk), as well as deeper inside your body. Phlebitis is usually not serious when it occurs close to the surface of the body. However, it can cause serious problems when it occurs in a vein deeper inside the body. CAUSES  Phlebitis can be triggered by various things, including:   Reduced blood flow through your veins. This can happen with:  Bed rest over a long period. Long-distance travel.Enoxaparin, Home Use Enoxaparin (Lovenox) injection is a medication used to prevent clots from developing in your veins. Medications such as enoxaparin are called blood thinners or anticoagulants. If blood clots are untreated they could travel to your lungs. This is called a pulmonary embolus. A blood clot in your lungs can be fatal. Caregivers often use anticoagulants such as enoxaparin to prevent clots following surgery. It is also used along with aspirin when the heart is not getting enough blood. Continue the enoxaparin injections as directed by your caregiver. Your caregiver will use blood clotting test results to decide when you can safely stop using enoxaparin injections. If your caregiver prescribes any additional anticoagulant, you must take it exactly as directed. RISKS AND COMPLICATIONS If you have received recent epidural anesthesia, spinal anesthesia, or a spinal tap while receiving anticoagulants, you are at risk for developing a blood clot in or around the spine. This condition could result in long-term or permanent paralysis. Because anticoagulants thin your blood, severe bleeding may occur from any tissue or organ. Symptoms of the blood being too thin may include: Bleeding from the nose or gums that does not stop quickly. Unusual bruising or bruising easily. Swelling or pain at an injection site. A cut that does not stop bleeding within 10 minutes. Continual nausea for more than 1  day or vomiting blood. Coughing up blood. Blood in the urine which may appear as pink, red, or brown urine. Blood in bowel movements which may appear as red, dark or black stools. Sudden weakness or numbness of the face, arm, or leg, especially on one side of the body. Sudden confusion. Trouble speaking (aphasia) or understanding. Sudden trouble seeing in one or both eyes. Sudden trouble walking. Dizziness. Loss of balance or coordination. Severe pain, such as a headache, joint pain, or back pain. Fever. Bruising around the injection sites may be expected. Platelet drops, known as "thrombocytopenia," can occur with enoxaparin use. A condition called "heparin-induced thrombocytopenia" has been seen. If you have had this condition, you should tell your caregiver. Your caregiver may direct you to have blood tests to monitor this condition. Do not use if you have allergies to the medication, heparin, or pork products. Other side effects may include mild local reactions or irritation at the site of injection, pain, bruising, and redness of skin. HOME CARE INSTRUCTIONS You will be instructed by your caregiver how to give enoxaparin injections. Before giving your medication you should make sure the injection is a clear and colorless or pale yellow solution. If your medication becomes discolored or has particles in the bottle, do not use and notify your caregiver. When using the 30 and 40 mg pre-filled syringes, do not expel the air bubble from the syringe before the injection. This makes sure you use all the medication in the syringe. The injections will be given subcutaneously. This means it is given into the fat over the belly (abdomen). It is given deep beneath the skin but not into the muscle.  The shots should be injected around the abdominal wall. Change the sites of injection each time. The whole length of the needle should be introduced into a skin fold held between the thumb and forefinger; the  skin fold should be held throughout the injection. Do not rub the injection site after completion of the injection. This increases bruising. Enoxaparin injection pre-filled syringes and graduated pre-filled syringes are available with a system that shields the needle after injection. Inject by pushing the plunger to the bottom of the syringe. Remove the syringe from the injection site keeping your finger on the plunger rod. Be careful not to stick yourself or others. After injection and the syringe is empty, set off the safety system by firmly pushing the plunger rod. The protective sleeve will automatically cover the needle and you can hear a click. The click means your needle is safely covered. Do not try replacing the needle shield. Get rid of the syringe in the nearest sharps container. Keep your medication safely stored at room temperatures. Due to the complications of anticoagulants, it is very important that you take your anticoagulant as directed by your caregiver. Anticoagulants need to be taken exactly as instructed. Be sure you understand all your anticoagulant instructions. Changes in medicines, supplements, diet, and illness can affect your anticoagulation therapy. Be sure to inform your caregivers of any of these changes. While on anticoagulants, you will need to have blood tests done routinely as directed by your caregivers. Be careful not to cut yourself when using sharp objects. Limit physical activities or sports that could result in a fall or cause injury. It is extremely important that you tell all of your caregivers and dentist that you are taking an anticoagulant, especially if you are injured or plan to have any type of procedure or operation. Follow up with your laboratory test and caregiver appointments as directed. It is very important to keep your appointments. Not keeping appointments could result in a chronic or permanent injury, pain, or disability. SEEK MEDICAL CARE  IF: You develop any rashes. You have any worsening of the condition for which you are receiving anticoagulation therapy. SEEK IMMEDIATE MEDICAL CARE IF: Bleeding from the nose or gums does not stop quickly. You have unusual bruising or are bruising easily. Swelling or pain occurs at an injection site. A cut does not stop bleeding within 10 minutes. You have continual nausea for more than 1 day or are vomiting blood. You are coughing up blood. You have blood in the urine. You have dark or black stools. You have sudden weakness or numbness of the face, arm, or leg, especially on one side of the body. You have sudden confusion. You have trouble speaking (aphasia) or understanding. You have sudden trouble seeing in one or both eyes. You have sudden trouble walking. You have dizziness. You have a loss of balance or coordination. You have severe pain, such as a headache, joint pain, or back pain. You have a serious fall or head injury, even if you are not bleeding. You have an oral temperature above 102 F (38.9 C), not controlled by medicine. ANY OF THESE SYMPTOMS MAY REPRESENT A SERIOUS PROBLEM THAT IS AN EMERGENCY. Do not wait to see if the symptoms will go away. Get medical help right away. Call your local emergency services (911 in [place] DO NOT drive yourself to the hospital. MAKE SURE YOU: Understand these instructions. Will watch your condition. Will get help right away if you are not doing well or get  worse. Document Released: 10/09/2004 Document Revised: 03/01/2012 Document Reviewed: 12/08/2005 [facility] Patient Information 2015 [place], [place]. This information is not intended to replace advice given to you by your health care provider. Make sure you discuss any questions you have with your health care provider.    Injury.  Surgery.  Being overweight (obese) or pregnant.  Having an IV tube put in the vein and getting certain medicines through the vein.  Cancer and cancer  treatment.  Use of illegal drugs taken through the vein.  Inflammatory diseases.  Inherited (genetic) diseases that increase the risk of blood clots.  Hormone therapy, such as birth control pills. SIGNS AND SYMPTOMS   Red, tender, swollen, and painful area on your skin. Usually, the area will be long and narrow.  Firmness along the center of the affected area. This can indicate that a blood clot has formed.  Low-grade fever. DIAGNOSIS  A health care provider can usually diagnose phlebitis by examining the affected area and asking about your symptoms. To check for infection or blood clots, your health care provider may order blood tests or an ultrasound exam of the area. Blood tests and your family history may also indicate if you have an underlying genetic disease that causes blood clots. Occasionally, a piece of tissue is taken from the body (biopsy sample) if an unusual cause of phlebitis is suspected. TREATMENT  Treatment will vary depending on the severity of the condition and the area of the body affected. Treatment may include:  Use of a warm compress or heating pad.  Use of compression stockings or bandages.  Anti-inflammatory medicines.  Removal of any IV tube that may be causing the problem.  Medicines that kill germs (antibiotics) if an infection is present.  Blood-thinning medicines if a blood clot is suspected or present.  In rare cases, surgery may be needed to remove damaged sections of vein. HOME CARE INSTRUCTIONS   Only take over-the-counter or prescription medicines as directed by your health care provider. Take all medicines exactly as prescribed.  Raise (elevate) the affected area above the level of your heart as directed by your health care provider.  Apply a warm compress or heating pad to the affected area as directed by your health care provider. Do not sleep with the heating pad.  Use compression stockings or bandages as directed. These will speed  healing and prevent the condition from coming back.  If you are on blood thinners:  Get follow-up blood tests as directed by your health care provider.  Check with your health care provider before using any new medicines.  Carry a medical alert card or wear your medical alert jewelry to show that you are on blood thinners.  For phlebitis in the legs:  Avoid prolonged standing or bed rest.  Keep your legs moving. Raise your legs when sitting or lying.  Do not smoke.  Women, particularly those over the age of 80, should consider the risks and benefits of taking the contraceptive pill. This kind of hormone treatment can increase your risk for blood clots.  Follow up with your health care provider as directed. SEEK MEDICAL CARE IF:   You have unusual bruising or any bleeding problems.  Your swelling or pain in the affected area is not improving.  You are on anti-inflammatory medicine, and you develop belly (abdominal) pain. SEEK IMMEDIATE MEDICAL CARE IF:   You have a sudden onset of chest pain or difficulty breathing.  You have a fever or persistent symptoms for  more than 2-3 days.  You have a fever and your symptoms suddenly get worse. MAKE SURE YOU:  Understand these instructions.  Will watch your condition.  Will get help right away if you are not doing well or get worse. Document Released: 12/02/2001 Document Revised: 09/28/2013 Document Reviewed: 08/15/2013 [facility] Patient Information 2015 [place], [place]. This information is not intended to replace advice given to you by your health care provider. Make sure you discuss any questions you have with your health care provider.

## 2015-02-17 NOTE — Progress Notes (Signed)
In to assess pt and discuss lower extremity doppler results. Pt reports SOB and mild chest pain when laying flat starting last night.  Currently intermittent.  Sister is at bedside and reports their father had 2 DVTs, unprovoked, etiology unknown.  Pt has never had varicose veins until this pregnancy.  Denies h/o blood clots.  VS: 123/87 HR 86 R 18 T 98.  Gen:  NAD, comfortable smiling. CV:  RRR Lungs: CTA bilaterally Rt Leg:  Erythematous warm mass in medial aspect of popliteal fossa.  Right inner ankle with large area of ecchymosis, nontender, not warm, soft.  CBC, CMP, PT/INR normal.  Preliminary right lower extremity doppler:  Thrombosed varicosity in the proximal thigh extending to just above the knee.  A:   Right LE thrombosed varicosity SOB, chest pain. First degree relative with h/o DVT. PPD # 6, breast feeding.  P: Consulted Dr. Oneida Alar, Vascular.  Plans to see pt in MAU.  Likely will need steriods. Spiral CT of chest to rule out PE. Coagulopathy w/u with hematology. Plan d/w pt and sister.  Questions answered.

## 2015-02-18 ENCOUNTER — Telehealth: Payer: Self-pay | Admitting: Vascular Surgery

## 2015-02-18 ENCOUNTER — Other Ambulatory Visit: Payer: Self-pay | Admitting: *Deleted

## 2015-02-18 DIAGNOSIS — M79606 Pain in leg, unspecified: Secondary | ICD-10-CM

## 2015-02-18 NOTE — Telephone Encounter (Signed)
Received call from pts pharmacy today.  Pt cannot afford Lovenox.  Pharmacy was able to provide 24 hrs worth of Lovenox.  We will start her on coumadin 5 mg daily today.  I will see if we can arrange for [place] to follow her levels and check her INR on Tuesday this week.    As far as I can tell there are no reports of problems with warfarin and breastfeeding and I explained this to the patient.  Ruta Hinds, MD [facility] Office: [phone] Pager: [phone]

## 2015-02-19 ENCOUNTER — Telehealth: Payer: Self-pay | Admitting: Internal Medicine

## 2015-02-19 ENCOUNTER — Telehealth: Payer: Self-pay | Admitting: Vascular Surgery

## 2015-02-19 NOTE — Telephone Encounter (Signed)
Spoke with patient to schedule appt, dpm

## 2015-02-19 NOTE — Telephone Encounter (Signed)
-----   Message from Mena Goes, RN sent at 02/18/2015  1:33 PM EST ----- Regarding: Schedule   ----- Message -----    From: Elam Dutch, MD    Sent: 02/17/2015   8:04 PM      To: [facility]vs Charge Pool  Level 3 consult for superficial thrombophlebitis  Pt needs appt with me in 1 month with bilateral venous reflux study at that time.  Ruta Hinds

## 2015-02-19 NOTE — Telephone Encounter (Signed)
Call pt, have her make a follow up appt with me this week on Thursday/Friday. We can check her INR at that time.

## 2015-02-19 NOTE — Telephone Encounter (Signed)
Pt has a f/u appt 02/23/2015

## 2015-02-19 NOTE — Telephone Encounter (Signed)
Lauren Solis from Dr Nona Dell office called because the pt has been started on coumadin 5 and needs follow up care and labs.  Please call pt to let her know when she needs to come in for her lab appointment.  If there are any questions, you can call Dr Nona Dell office at [phone]. Thanks.

## 2015-02-23 ENCOUNTER — Ambulatory Visit (INDEPENDENT_AMBULATORY_CARE_PROVIDER_SITE_OTHER): Payer: Medicaid Other | Admitting: Internal Medicine

## 2015-02-23 ENCOUNTER — Encounter: Payer: Self-pay | Admitting: Internal Medicine

## 2015-02-23 VITALS — BP 122/84 | HR 81 | Temp 98.6°F | Wt 185.0 lb

## 2015-02-23 DIAGNOSIS — I8001 Phlebitis and thrombophlebitis of superficial vessels of right lower extremity: Secondary | ICD-10-CM | POA: Insufficient documentation

## 2015-02-23 LAB — POCT INR: INR: 2.1

## 2015-02-23 NOTE — Progress Notes (Signed)
Pre visit review using our clinic review tool, if applicable. No additional management support is needed unless otherwise documented below in the visit note. 

## 2015-02-23 NOTE — Progress Notes (Signed)
Subjective:    Patient ID: Lauren Solis, female    DOB: [birth year], 29 y.o.   MRN: [id number]  HPI  Pt presents to the clinic today to follow up her vascular appt. She was seen by Dr. Oneida Alar 02/17/15 with c/o pain and swelling to her right medial knee. She was 6 days postpartum at that time. Ultrasound of the LLE showed a superficial thrombus. CTA of chest did not show any evidence of PE. She was started on Lovenox daily x 1 month. She reports she was not able to afford Lovenox and was changed to coumadin 5 mg daily on 02/18/15. She is here today for followup and INR check. She has noticed any s/s of bleeding. She denies difficulty breathing. She report reduced pain and swelling of the medial knee. She reports she does occasionally have pain at night when she tried to bend her knees.  Review of Systems      Past Medical History  Diagnosis Date  . Medical history non-contributory     Current Outpatient Prescriptions  Medication Sig Dispense Refill  . enoxaparin (LOVENOX) 40 MG/0.4ML injection Inject 0.9 mLs (90 mg total) into the skin every 12 (twelve) hours. 28 Syringe 1  . ibuprofen (ADVIL,MOTRIN) 600 MG tablet Take 1 tablet (600 mg total) by mouth every 6 (six) hours as needed. (Patient taking differently: Take 600 mg by mouth every 6 (six) hours as needed for mild pain. ) 30 tablet 2  . oxyCODONE-acetaminophen (PERCOCET/ROXICET) 5-325 MG per tablet Take 1-2 tablets by mouth every 6 (six) hours as needed (for pain scale equal to or greater than 7). 45 tablet 0   No current facility-administered medications for this visit.    No Known Allergies  Family History  Problem Relation Age of Onset  . Cancer Maternal Aunt     Breast  . Diabetes Maternal Aunt   . Diabetes Maternal Uncle   . Kidney disease Maternal Grandmother     History   Social History  . Marital Status: Married    Spouse Name: N/A  . Number of Children: N/A  . Years of Education: N/A   Occupational  History  . Not on file.   Social History Main Topics  . Smoking status: Never Smoker   . Smokeless tobacco: Not on file  . Alcohol Use: No  . Drug Use: No  . Sexual Activity: Yes    Birth Control/ Protection: None   Other Topics Concern  . Not on file   Social History Narrative     Constitutional: Denies fever, malaise, fatigue, headache or abrupt weight changes.  Respiratory: Denies difficulty breathing, shortness of breath, cough or sputum production.   Cardiovascular: Denies chest pain, chest tightness, palpitations or swelling in the hands or feet.  Gastrointestinal: Denies abdominal pain, bloating, constipation, diarrhea or blood in the stool.  GU: Denies urgency, frequency, pain with urination, burning sensation, blood in urine, odor or discharge. Musculoskeletal: Denies decrease in range of motion, difficulty with gait, muscle pain or joint pain and swelling.   No other specific complaints in a complete review of systems (except as listed in HPI above).  Objective:   Physical Exam  BP 122/84 mmHg  Pulse 81  Temp(Src) 98.6 F (37 C) (Oral)  Wt 185 lb (83.915 kg)  SpO2 98%  LMP 04/14/2014  Breastfeeding? Yes Wt Readings from Last 3 Encounters:  02/23/15 185 lb (83.915 kg)  02/12/15 204 lb 2 oz (92.59 kg)  12/28/14 183 lb 3 oz (83.093  kg)    General: Appears her stated age, well developed, well nourished in NAD. Skin: Warm, dry and intact. No erythema noted of right medial knee. Cardiovascular: Normal rate and rhythm. S1,S2 noted.  No murmur, rubs or gallops noted.  Pulmonary/Chest: Normal effort and positive vesicular breath sounds. No respiratory distress. No wheezes, rales or ronchi noted.  Musculoskeletal: Normal flexion and extension of the knee. Mild medial swelling noted of the right knee.  BMET    Component Value Date/Time   NA 138 02/17/2015 1613   K 4.1 02/17/2015 1613   CL 111 02/17/2015 1613   CO2 25 02/17/2015 1613   GLUCOSE 83 02/17/2015 1613    BUN 15 02/17/2015 1613   CREATININE 0.71 02/17/2015 1613   CALCIUM 8.6 02/17/2015 1613   GFRNONAA >90 02/17/2015 1613   GFRAA >90 02/17/2015 1613    Lipid Panel     Component Value Date/Time   CHOL 166 05/05/2014 1001   TRIG 34.0 05/05/2014 1001   HDL 60.80 05/05/2014 1001   CHOLHDL 3 05/05/2014 1001   VLDL 6.8 05/05/2014 1001   LDLCALC 98 05/05/2014 1001    CBC    Component Value Date/Time   WBC 5.3 02/17/2015 1613   RBC 4.54 02/17/2015 1613   HGB 10.5* 02/17/2015 1613   HCT 30.8* 02/17/2015 1613   PLT 216 02/17/2015 1613   MCV 67.8* 02/17/2015 1613   MCH 23.1* 02/17/2015 1613   MCHC 34.1 02/17/2015 1613   RDW 18.2* 02/17/2015 1613   LYMPHSABS 1.5 11/07/2014 1010   MONOABS 0.6 11/07/2014 1010   EOSABS 0.1 11/07/2014 1010   BASOSABS 0.1 11/07/2014 1010    Hgb A1C No results found for: HGBA1C       Assessment & Plan:

## 2015-02-23 NOTE — Assessment & Plan Note (Signed)
On 5 mg of coumadin daily Discussed things that increase and decrease her INR Handout given Will check INR today-  2.1, continue 5 mg daily, recheck inr in 1 week  Greater than 50 % time counseling and educating pt on coumadin, side effects, interaction etc.

## 2015-02-23 NOTE — Patient Instructions (Signed)
Prothrombin Time, International Normalized Ratio A Prothrombin Time (Protime, PT) measures the time that it takes for your blood to clot. The International Normalized Ratio (INR) is a calculation of blood clotting time based upon your PT result. Most laboratories report both PT and INR values when reporting blood clotting times. The PT is used to determine:  Certain medical conditions that can cause bleeding disorders. These can include:  Liver disease.  Systemic infection (sepsis).  Inherited (genetic) bleeding disorders.  The appropriate dose of warfarin (Coumadin) needed to treat various conditions that require the use of a blood thinner (anticoagulant). There is no standard warfarin dose. Each person is unique and will require their own warfarin dose based on PT and INR levels. An INR should only be used to determine the appropriate dose of an anticoagulant. It is important to know that:  PT and INR levels are checked per your caregiver's recommendations. It is very important to keep your PT and INR lab appointments. Not keeping the appointments could result in a chronic or permanent injury, pain, and disability. If there is any problem keeping the appointment, you must call your caregiver.  PT and INR levels can be affected by certain foods you eat. It is important to notify your caregiver if you have a change in your diet.  PT and INR levels can be affected by some medications. It is important when taking new prescriptions or over-the-counter medications that you notify your caregiver.  Different medical conditions may require different PT and INR levels. For example, a mechanical heart valve may require a higher INR level than those being treated for a clot in the leg. PREPARATION FOR TEST A blood sample is obtained by inserting a needle into a vein in the arm. If you are on warfarin therapy, the blood sample should be obtained before taking your daily dose. NORMAL FINDINGS  A  normal PT is 10 to 13 seconds.  A normal INR level (without anticoagulant therapy) is 0.9 to 1.1.  INR levels (with anticoagulant therapy) generally range from 2 to 3.5 depending on the medical condition that warfarin is being used to treat.  INR levels greater than 3.5 mean that it is taking too long to form a blood clot. Ranges for normal findings may vary among different laboratories and hospitals. You should always check with your doctor after having lab work or other tests done to discuss the meaning of your test results and whether your values are considered within normal limits. MEANING OF TEST  Your caregiver will go over the test results with you and discuss the importance and meaning of your results, as well as treatment options and the need for additional tests if necessary. OBTAINING THE TEST RESULTS It is your responsibility to obtain your test results. Ask the lab or department performing the test when and how you will get your results. SEEK IMMEDIATE MEDICAL CARE IF:   You develop bleeding from the nose, mouth, or gums.  You vomit or cough up blood.  Your bowel movements are bloody or black in color.  You have a severe headache that does not go away.  You skin bruises easily. Document Released: 01/10/2005 Document Revised: 03/01/2012 Document Reviewed: 11/19/2008 [facility] Patient Information 2015 [place], [place]. This information is not intended to replace advice given to you by your health care provider. Make sure you discuss any questions you have with your health care provider.

## 2015-03-01 ENCOUNTER — Other Ambulatory Visit (INDEPENDENT_AMBULATORY_CARE_PROVIDER_SITE_OTHER): Payer: Medicaid Other

## 2015-03-01 DIAGNOSIS — I8001 Phlebitis and thrombophlebitis of superficial vessels of right lower extremity: Secondary | ICD-10-CM

## 2015-03-01 LAB — POCT INR: INR: 2

## 2015-03-02 MED ORDER — WARFARIN SODIUM 5 MG PO TABS: 5.0000 mg | ORAL_TABLET | Freq: Once | ORAL | Status: DC

## 2015-03-02 NOTE — Addendum Note (Signed)
Addended by: Lurlean Nanny on: 03/02/2015 10:41 AM   Modules accepted: Orders

## 2015-03-08 ENCOUNTER — Other Ambulatory Visit (INDEPENDENT_AMBULATORY_CARE_PROVIDER_SITE_OTHER): Payer: Medicaid Other

## 2015-03-08 DIAGNOSIS — I8001 Phlebitis and thrombophlebitis of superficial vessels of right lower extremity: Secondary | ICD-10-CM

## 2015-03-08 LAB — POCT INR: INR: 3.1

## 2015-03-15 ENCOUNTER — Other Ambulatory Visit (INDEPENDENT_AMBULATORY_CARE_PROVIDER_SITE_OTHER): Payer: Medicaid Other

## 2015-03-15 DIAGNOSIS — I8001 Phlebitis and thrombophlebitis of superficial vessels of right lower extremity: Secondary | ICD-10-CM | POA: Diagnosis not present

## 2015-03-15 LAB — POCT INR: INR: 3.8

## 2015-03-22 ENCOUNTER — Other Ambulatory Visit (INDEPENDENT_AMBULATORY_CARE_PROVIDER_SITE_OTHER): Payer: Medicaid Other

## 2015-03-22 DIAGNOSIS — I8001 Phlebitis and thrombophlebitis of superficial vessels of right lower extremity: Secondary | ICD-10-CM | POA: Diagnosis not present

## 2015-03-22 LAB — POCT INR: INR: 1.7

## 2015-03-29 ENCOUNTER — Other Ambulatory Visit (INDEPENDENT_AMBULATORY_CARE_PROVIDER_SITE_OTHER): Payer: Medicaid Other

## 2015-03-29 DIAGNOSIS — I8001 Phlebitis and thrombophlebitis of superficial vessels of right lower extremity: Secondary | ICD-10-CM | POA: Diagnosis not present

## 2015-03-29 LAB — POCT INR: INR: 1.4

## 2015-04-04 ENCOUNTER — Other Ambulatory Visit: Payer: Self-pay | Admitting: *Deleted

## 2015-04-04 ENCOUNTER — Encounter: Payer: Self-pay | Admitting: Vascular Surgery

## 2015-04-04 ENCOUNTER — Encounter: Payer: Self-pay | Admitting: *Deleted

## 2015-04-04 ENCOUNTER — Ambulatory Visit (HOSPITAL_COMMUNITY)
Admission: RE | Admit: 2015-04-04 | Discharge: 2015-04-04 | Disposition: A | Discharge status: Home / Self Care | Payer: Medicaid Other | Source: Ambulatory Visit | Attending: Vascular Surgery | Admitting: Vascular Surgery

## 2015-04-04 ENCOUNTER — Ambulatory Visit (INDEPENDENT_AMBULATORY_CARE_PROVIDER_SITE_OTHER): Payer: Medicaid Other | Admitting: Vascular Surgery

## 2015-04-04 VITALS — BP 123/78 | HR 72 | Temp 97.8°F | Resp 14 | Ht 66.0 in | Wt 180.0 lb

## 2015-04-04 DIAGNOSIS — I872 Venous insufficiency (chronic) (peripheral): Secondary | ICD-10-CM

## 2015-04-04 DIAGNOSIS — I83893 Varicose veins of bilateral lower extremities with other complications: Secondary | ICD-10-CM

## 2015-04-05 ENCOUNTER — Inpatient Hospital Stay (HOSPITAL_COMMUNITY)
Admission: RE | Admit: 2015-04-05 | Discharge: 2015-04-05 | Disposition: A | Discharge status: Home / Self Care | Payer: Medicaid Other | Source: Ambulatory Visit

## 2015-04-05 ENCOUNTER — Other Ambulatory Visit (INDEPENDENT_AMBULATORY_CARE_PROVIDER_SITE_OTHER): Payer: Medicaid Other

## 2015-04-05 ENCOUNTER — Ambulatory Visit: Payer: Medicaid Other | Admitting: Vascular Surgery

## 2015-04-05 DIAGNOSIS — M79606 Pain in leg, unspecified: Secondary | ICD-10-CM

## 2015-04-05 DIAGNOSIS — I8001 Phlebitis and thrombophlebitis of superficial vessels of right lower extremity: Secondary | ICD-10-CM

## 2015-04-05 LAB — POCT INR: INR: 1.5

## 2015-04-05 NOTE — Progress Notes (Signed)
[facility] [place] HISTORY AND PHYSICAL   Requesting: Dr Thurnell Lose Reason for consult: superficial thrombophlebitis History of Present Illness:  Patient is a 29 y.o. year old female who presents for evaluation of painful swollen area right medial knee.  She was seen and evaluated for a superficial thrombophlebitis approximately one month ago. The pain has improved significantly since then. She was 6 days postpartum at that time. She began to develop swollen legs with some varicosities during her pregnancy.  She did wear compression stockings during this period.  The patient does have a family history of superficial venous thrombosis as well as pulmonary embolus in her father.     She is currently on Coumadin.   Past Medical History   Diagnosis  Date   .  Medical history non-contributory         Past Surgical History   Procedure  Laterality  Date   .  Oophorectomy    2010   .  Tubal ligation  Bilateral  02/13/2015       Procedure: POST PARTUM TUBAL LIGATION;  Surgeon: Alinda Dooms, MD;  Location: [facility];  Service: Gynecology;  Laterality: Bilateral;   .  Bilateral salpingectomy  Bilateral  02/13/2015       Procedure: BILATERAL SALPINGECTOMY;  Surgeon: Alinda Dooms, MD;  Location: [facility];  Service: Gynecology;  Laterality: Bilateral;     Social History History   Substance Use Topics   .  Smoking status:  Never Smoker    .  Smokeless tobacco:  Not on file   .  Alcohol Use:  No     Family History Family History   Problem  Relation  Age of Onset   .  Cancer  Maternal Aunt         Breast   .  Diabetes  Maternal Aunt     .  Diabetes  Maternal Uncle     .  Kidney disease  Maternal Grandmother       Allergies  No Known Allergies     Current Outpatient Prescriptions on File Prior to Visit  Medication Sig Dispense Refill  . ibuprofen (ADVIL,MOTRIN) 600 MG tablet Take 1 tablet (600 mg total) by mouth every 6 (six) hours as needed. (Patient taking  differently: Take 600 mg by mouth every 6 (six) hours as needed for mild pain. ) 30 tablet 2  . warfarin (COUMADIN) 5 MG tablet Take 1 tablet (5 mg total) by mouth one time only at 6 PM. 30 tablet 1  . oxyCODONE-acetaminophen (PERCOCET/ROXICET) 5-325 MG per tablet Take 1-2 tablets by mouth every 6 (six) hours as needed (for pain scale equal to or greater than 7). (Patient not taking: Reported on 02/23/2015) 45 tablet 0   No current facility-administered medications on file prior to visit.    ROS:    General:  No weight loss, Fever, chills  Vascular: No history of rest pain in feet.  No history of claudication.  No history of non-healing ulcer, No history of DVT    Pulmonary: No home oxygen, no productive cough, no hemoptysis,  No asthma or wheezing  Hematologic:No history of hypercoagulable state.  No history of easy bleeding.       Physical Examination    Filed Vitals:   04/04/15 1533  BP: 123/78  Pulse: 72  Temp: 97.8 F (36.6 C)  TempSrc: Oral  Resp: 14  Height: 5\' 6"  (1.676 m)  Weight: 180 lb (81.647 kg)  SpO2: 100%    General:  Alert and oriented, no acute distress HEENT: Normal Neck: No JVD Pulmonary: Symmetric chest excursion no work of breathing Cardiac: Regular Rate and Rhythm Abdomen: Soft, non-tender Skin: No rash,  right medial knee, palpable cord tender to palpation Extremity Pulses:  2+ radial, brachial, femoral, dorsalis pedis,pulses bilaterally Musculoskeletal: No deformity trace edema left slightly greater than right Neurologic: Upper and lower extremity motor 5/5 and symmetric  DATA:  venous duplex today shows evidence of superficial venous reflux in her greater saphenous vein. She still has evidence of her thrombosed varicosity.  ASSESSMENT:  Superficial thrombophlebitis right leg most likely associated with stasis and hypercoagulable state of pregnancy but also does have family history   PLAN:  #1 continue Coumadin therapy for total of 3 months 2.  Warm compresses to relieve pain  3.  NSAIDs to reduce inflammation and control pain.  4.  She can follow up in our office in 2 month at which time we will consider laser ablation of her greater saphenous vein.  Ruta Hinds, MD [facility] [place] Office: [phone] Pager: [phone]

## 2015-04-06 MED ORDER — WARFARIN SODIUM 5 MG PO TABS: 5.0000 mg | ORAL_TABLET | Freq: Once | ORAL | Status: DC

## 2015-04-06 NOTE — Addendum Note (Signed)
Addended by: Lurlean Nanny on: 04/06/2015 02:21 PM   Modules accepted: Orders

## 2015-04-12 ENCOUNTER — Other Ambulatory Visit (INDEPENDENT_AMBULATORY_CARE_PROVIDER_SITE_OTHER): Payer: Medicaid Other

## 2015-04-12 DIAGNOSIS — I8001 Phlebitis and thrombophlebitis of superficial vessels of right lower extremity: Secondary | ICD-10-CM | POA: Diagnosis not present

## 2015-04-12 LAB — POCT INR: INR: 1.3

## 2015-04-19 ENCOUNTER — Other Ambulatory Visit (INDEPENDENT_AMBULATORY_CARE_PROVIDER_SITE_OTHER): Payer: Medicaid Other

## 2015-04-19 DIAGNOSIS — I8001 Phlebitis and thrombophlebitis of superficial vessels of right lower extremity: Secondary | ICD-10-CM | POA: Diagnosis not present

## 2015-04-19 LAB — POCT INR: INR: 1.6

## 2015-04-26 ENCOUNTER — Other Ambulatory Visit (INDEPENDENT_AMBULATORY_CARE_PROVIDER_SITE_OTHER): Payer: Medicaid Other

## 2015-04-26 DIAGNOSIS — I8001 Phlebitis and thrombophlebitis of superficial vessels of right lower extremity: Secondary | ICD-10-CM | POA: Diagnosis not present

## 2015-04-26 LAB — POCT INR: INR: 1.8

## 2015-05-03 ENCOUNTER — Other Ambulatory Visit (INDEPENDENT_AMBULATORY_CARE_PROVIDER_SITE_OTHER): Payer: Medicaid Other

## 2015-05-03 DIAGNOSIS — I8001 Phlebitis and thrombophlebitis of superficial vessels of right lower extremity: Secondary | ICD-10-CM | POA: Diagnosis not present

## 2015-05-03 LAB — POCT INR: INR: 1.4

## 2015-05-10 ENCOUNTER — Other Ambulatory Visit (INDEPENDENT_AMBULATORY_CARE_PROVIDER_SITE_OTHER): Payer: Managed Care, Other (non HMO)

## 2015-05-10 DIAGNOSIS — I8001 Phlebitis and thrombophlebitis of superficial vessels of right lower extremity: Secondary | ICD-10-CM | POA: Diagnosis not present

## 2015-05-10 LAB — POCT INR: INR: 2.3

## 2015-05-24 ENCOUNTER — Other Ambulatory Visit: Payer: Managed Care, Other (non HMO)

## 2015-05-24 ENCOUNTER — Other Ambulatory Visit (INDEPENDENT_AMBULATORY_CARE_PROVIDER_SITE_OTHER): Payer: Managed Care, Other (non HMO)

## 2015-05-24 DIAGNOSIS — I8001 Phlebitis and thrombophlebitis of superficial vessels of right lower extremity: Secondary | ICD-10-CM

## 2015-05-24 LAB — POCT INR: INR: 2.2

## 2015-05-28 ENCOUNTER — Encounter: Payer: Self-pay | Admitting: Vascular Surgery

## 2015-05-31 ENCOUNTER — Encounter: Payer: Self-pay | Admitting: Vascular Surgery

## 2015-05-31 ENCOUNTER — Ambulatory Visit (INDEPENDENT_AMBULATORY_CARE_PROVIDER_SITE_OTHER): Payer: Managed Care, Other (non HMO) | Admitting: Vascular Surgery

## 2015-05-31 VITALS — BP 118/81 | HR 69 | Resp 16 | Ht 66.0 in | Wt 167.0 lb

## 2015-05-31 DIAGNOSIS — I83893 Varicose veins of bilateral lower extremities with other complications: Secondary | ICD-10-CM

## 2015-05-31 NOTE — Progress Notes (Signed)
[facility] [place] HISTORY AND PHYSICAL   Requesting: Dr Thurnell Lose Reason for consult: superficial thrombophlebitis History of Present Illness:  Patient is a 29 y.o. year old female who was seen and evaluated for a superficial thrombophlebitis approximately 10 weeks ago. The pain has improved significantly since then. She was 6 days postpartum at that time. She began to develop swollen legs with some varicosities during her pregnancy.  She has been compliant with her compression stockings. .  The patient does have a family history of superficial venous thrombosis as well as pulmonary embolus in her father.    She still has some occasional pain on the medial aspect of her right knee where the superficial thrombophlebitis occurred. She is currently on Coumadin.     Past Medical History    Diagnosis   Date    .   Medical history non-contributory           Past Surgical History    Procedure   Laterality   Date    .   Oophorectomy      2010    .   Tubal ligation   Bilateral   02/13/2015          Procedure: POST PARTUM TUBAL LIGATION;  Surgeon: Alinda Dooms, MD;  Location: [facility];  Service: Gynecology;  Laterality: Bilateral;    .   Bilateral salpingectomy   Bilateral   02/13/2015          Procedure: BILATERAL SALPINGECTOMY;  Surgeon: Alinda Dooms, MD;  Location: [facility];  Service: Gynecology;  Laterality: Bilateral;      Social History History    Substance Use Topics    .   Smoking status:   Never Smoker     .   Smokeless tobacco:   Not on file    .   Alcohol Use:   No      Family History Family History    Problem   Relation   Age of Onset    .   Cancer   Maternal Aunt             Breast    .   Diabetes   Maternal Aunt       .   Diabetes   Maternal Uncle       .   Kidney disease   Maternal Grandmother         Allergies  No Known Allergies     Current Outpatient Prescriptions on File Prior to Visit  Medication Sig Dispense Refill  . warfarin  (COUMADIN) 5 MG tablet Take 1 tablet (5 mg total) by mouth one time only at 6 PM. 30 tablet 1  . ibuprofen (ADVIL,MOTRIN) 600 MG tablet Take 1 tablet (600 mg total) by mouth every 6 (six) hours as needed. (Patient not taking: Reported on 05/31/2015) 30 tablet 2  . oxyCODONE-acetaminophen (PERCOCET/ROXICET) 5-325 MG per tablet Take 1-2 tablets by mouth every 6 (six) hours as needed (for pain scale equal to or greater than 7). (Patient not taking: Reported on 02/23/2015) 45 tablet 0   No current facility-administered medications on file prior to visit.    ROS:    General:  No weight loss, Fever, chills  Vascular: No history of rest pain in feet.  No history of claudication.  No history of non-healing ulcer, No history of DVT    Pulmonary: No home oxygen, no productive cough, no hemoptysis,  No asthma or wheezing  Hematologic:No history of hypercoagulable state.  No history of easy bleeding.       Physical Examination    Filed Vitals:   05/31/15 1333  BP: 118/81  Pulse: 69  Resp: 16  Height: 5\' 6"  (1.676 m)  Weight: 75.751 kg (167 lb)   General:  Alert and oriented, no acute distress Skin: No rash,  right medial knee, palpable cord tender to palpation Extremity Pulses:  2+ radial, brachial, femoral, dorsalis pedis,pulses bilaterally Musculoskeletal: No deformity or edema  Neurologic: Upper and lower extremity motor 5/5 and symmetric  DATA:  venous duplex from April 13 shows evidence of superficial venous reflux in her greater saphenous vein. Vein diameter was 6-9 mm. She still has evidence of her thrombosed varicosity.  ASSESSMENT:  Superficial thrombophlebitis right leg most likely associated with stasis and hypercoagulable state of pregnancy but also does have family history   PLAN:  #1 stop Coumadin at this point. We will schedule her for laser ablation of her saphenous vein in the near future.     Ruta Hinds, MD [facility] [place] Office:  [phone] Pager: [phone]

## 2015-06-08 ENCOUNTER — Encounter: Payer: Self-pay | Admitting: Vascular Surgery

## 2015-06-11 ENCOUNTER — Ambulatory Visit: Payer: Managed Care, Other (non HMO) | Admitting: Vascular Surgery

## 2015-06-13 ENCOUNTER — Encounter: Payer: Self-pay | Admitting: Vascular Surgery

## 2015-06-18 ENCOUNTER — Ambulatory Visit (INDEPENDENT_AMBULATORY_CARE_PROVIDER_SITE_OTHER): Payer: Managed Care, Other (non HMO) | Admitting: Vascular Surgery

## 2015-06-18 ENCOUNTER — Encounter: Payer: Self-pay | Admitting: Vascular Surgery

## 2015-06-18 VITALS — BP 117/80 | HR 69 | Resp 16 | Ht 66.0 in | Wt 169.0 lb

## 2015-06-18 DIAGNOSIS — I83893 Varicose veins of bilateral lower extremities with other complications: Secondary | ICD-10-CM | POA: Diagnosis not present

## 2015-06-18 NOTE — Progress Notes (Signed)
Subjective:     Patient ID: Lauren Solis, female   DOB: [birth year], 29 y.o.   MRN: [id number]  HPI this 29 year old female returns for continued follow-up regarding her painful varicosities and recent episode of acute thrombophlebitis in the right distal thigh. This occurred following a pregnancy area she delivered her fifth child in February 2016 and developed thrombophlebitis shortly thereafter. She was treated with anticoagulation for 3 months and it has now been discontinued. She has been having aching throbbing and burning discomfort which has not been relieved by elevation ibuprofen or elastic compression stockings. She's noticed darkening of the skin in the ankle area on the right but his had no bleeding or skin breakdown. She does develop swelling is the day progresses.  Past Medical History  Diagnosis Date  . Medical history non-contributory   . Varicose veins   . DVT (deep venous thrombosis)     History  Substance Use Topics  . Smoking status: Never Smoker   . Smokeless tobacco: Not on file  . Alcohol Use: No    Family History  Problem Relation Age of Onset  . Cancer Maternal Aunt     Breast  . Diabetes Maternal Aunt   . Diabetes Maternal Uncle   . Kidney disease Maternal Grandmother     No Known Allergies   Current outpatient prescriptions:  .  ibuprofen (ADVIL,MOTRIN) 600 MG tablet, Take 1 tablet (600 mg total) by mouth every 6 (six) hours as needed. (Patient not taking: Reported on 05/31/2015), Disp: 30 tablet, Rfl: 2 .  oxyCODONE-acetaminophen (PERCOCET/ROXICET) 5-325 MG per tablet, Take 1-2 tablets by mouth every 6 (six) hours as needed (for pain scale equal to or greater than 7). (Patient not taking: Reported on 02/23/2015), Disp: 45 tablet, Rfl: 0 .  warfarin (COUMADIN) 5 MG tablet, Take 1 tablet (5 mg total) by mouth one time only at 6 PM., Disp: 30 tablet, Rfl: 1  Filed Vitals:   06/18/15 1212  BP: 117/80  Pulse: 69  Resp: 16  Height: 5\' 6"  (1.676 m)   Weight: 169 lb (76.658 kg)    Body mass index is 27.29 kg/(m^2).           Review of Systems denies chest pain, dyspnea on exertion, PND, orthopnea, hemoptysis.     Objective:   Physical Exam BP 117/80 mmHg  Pulse 69  Resp 16  Ht 5\' 6"  (1.676 m)  Wt 169 lb (76.658 kg)  BMI 27.29 kg/m2  Gen. well-developed well-nourished female in no apparent distress alert and oriented 3 Lungs no rhonchi or wheezing Right leg with bulging varicosities in the medial distal thigh and proximal calf and posterior calf area with cluster of reticular veins around medial malleolus with some early hyperpigmentation but no active ulceration. 3+ dorsalis pedis pulse palpable.  Patient has documented gross reflux in right great saphenous and right small saphenous veins which are large caliber veins supplying these painful varicosities     Assessment:     Painful varicosities right leg with recent episode of acute thrombophlebitis. Patient has gross reflux right great and small saphenous veins documented. Patient has symptoms which are affecting her daily living and resistant to conservative measures.    Plan:     Patient needs number-one laser ablation left great saphenous vein +10-20 stab phlebectomy of painful varicosities followed by #2 laser ablation right small saphenous vein We'll proceed with precertification to perform this in the near future

## 2015-06-21 ENCOUNTER — Ambulatory Visit: Payer: Managed Care, Other (non HMO)

## 2015-06-21 ENCOUNTER — Other Ambulatory Visit: Payer: Managed Care, Other (non HMO)

## 2015-07-10 ENCOUNTER — Other Ambulatory Visit: Payer: Self-pay | Admitting: *Deleted

## 2015-07-10 DIAGNOSIS — I83811 Varicose veins of right lower extremities with pain: Secondary | ICD-10-CM

## 2015-07-12 ENCOUNTER — Encounter: Payer: Self-pay | Admitting: Vascular Surgery

## 2015-07-16 ENCOUNTER — Encounter: Payer: Self-pay | Admitting: Vascular Surgery

## 2015-07-16 ENCOUNTER — Ambulatory Visit (INDEPENDENT_AMBULATORY_CARE_PROVIDER_SITE_OTHER): Payer: Managed Care, Other (non HMO) | Admitting: Vascular Surgery

## 2015-07-16 VITALS — BP 112/77 | HR 98 | Temp 97.1°F | Resp 16 | Ht 66.0 in | Wt 167.0 lb

## 2015-07-16 DIAGNOSIS — I83893 Varicose veins of bilateral lower extremities with other complications: Secondary | ICD-10-CM

## 2015-07-16 NOTE — Progress Notes (Signed)
Subjective:     Patient ID: Lauren Solis, female   DOB: 09-29-1986, 29 y.o.   MRN: [id number]  HPI this 29 year old female had laser ablation of the right great saphenous vein from the distal thigh to near the saphenofemoral junction performed under local tumescent anesthesia +10-20 stab phlebectomy of painful varicosities. A total of 2329 J of energy was utilized. She tolerated the procedure well.   Review of Systems     Objective:   Physical Exam BP 112/77 mmHg  Pulse 98  Temp(Src) 97.1 F (36.2 C)  Resp 16  Ht 5\' 6"  (1.676 m)  Wt 167 lb (75.751 kg)  BMI 26.97 kg/m2       Assessment:     Well-tolerated laser ablation right great saphenous vein from distal thigh to near saphenofemoral junction +10-20 stab phlebectomy of painful varicosities performed under local tumescent anesthesia    Plan:     Return August 1 for venous duplex exam to confirm closure right great saphenous vein

## 2015-07-16 NOTE — Progress Notes (Signed)
Laser Ablation Procedure    Date: 07/16/2015   [name] DOB:1986-02-13  Consent signed: Yes    Surgeon:  Dr. Nelda Severe. Kellie Simmering  Procedure: Laser Ablation: right Greater Saphenous Vein  BP 112/77 mmHg  Pulse 98  Temp(Src) 97.1 F (36.2 C)  Resp 16  Ht 5\' 6"  (1.676 m)  Wt 167 lb (75.751 kg)  BMI 26.97 kg/m2  Tumescent Anesthesia: 450 cc 0.9% NaCl with 50 cc Lidocaine HCL with 1% Epi and 15 cc 8.4% NaHCO3  Local Anesthesia: 7 cc Lidocaine HCL and NaHCO3 (ratio 2:1)  Pulsed Mode: 15 watts, 562ms delay, 1.0 duration  Total Energy:    2329          Total Pulses:    156            Total Time: 2:36    Stab Phlebectomy: 10-20 Sites: Thigh and Calf  Patient tolerated procedure well  Notes:   Description of Procedure:  After marking the course of the secondary varicosities, the patient was placed on the operating table in the supine position, and the right leg was prepped and draped in sterile fashion.   Local anesthetic was administered and under ultrasound guidance the saphenous vein was accessed with a micro needle and guide wire; then the mirco puncture sheath was placed.  A guide wire was inserted saphenofemoral junction , followed by a 5 french sheath.  The position of the sheath and then the laser fiber below the junction was confirmed using the ultrasound.  Tumescent anesthesia was administered along the course of the saphenous vein using ultrasound guidance. The patient was placed in Trendelenburg position and protective laser glasses were placed on patient and staff, and the laser was fired at 15 watts continuous mode advancing 1-60mm/second for a total of 2329 joules.   For stab phlebectomies, local anesthetic was administered at the previously marked varicosities, and tumescent anesthesia was administered around the vessels.  Ten to 20 stab wounds were made using the tip of an 11 blade. And using the vein hook, the phlebectomies were performed using a hemostat to avulse the  varicosities.  Adequate hemostasis was achieved.     Steri strips were applied to the stab wounds and ABD pads and thigh high compression stockings were applied.  Ace wrap bandages were applied over the phlebectomy sites and at the top of the saphenofemoral junction. Blood loss was less than 15 cc.  The patient ambulated out of the operating room having tolerated the procedure well.

## 2015-07-17 ENCOUNTER — Telehealth: Payer: Self-pay | Admitting: *Deleted

## 2015-07-17 NOTE — Telephone Encounter (Signed)
Pt doing well. Some discomfort but nothing out of the ordinary. Following all instructions.

## 2015-07-18 ENCOUNTER — Encounter: Payer: Self-pay | Admitting: Vascular Surgery

## 2015-07-23 ENCOUNTER — Ambulatory Visit (HOSPITAL_COMMUNITY)
Admission: RE | Admit: 2015-07-23 | Discharge: 2015-07-23 | Disposition: A | Discharge status: Home / Self Care | Payer: Managed Care, Other (non HMO) | Source: Ambulatory Visit | Attending: Vascular Surgery | Admitting: Vascular Surgery

## 2015-07-23 ENCOUNTER — Ambulatory Visit (INDEPENDENT_AMBULATORY_CARE_PROVIDER_SITE_OTHER): Payer: Self-pay | Admitting: Vascular Surgery

## 2015-07-23 ENCOUNTER — Encounter: Payer: Self-pay | Admitting: Vascular Surgery

## 2015-07-23 VITALS — BP 112/82 | HR 67 | Temp 97.2°F | Resp 14 | Ht 66.0 in | Wt 165.0 lb

## 2015-07-23 DIAGNOSIS — I83811 Varicose veins of right lower extremities with pain: Secondary | ICD-10-CM | POA: Diagnosis present

## 2015-07-23 DIAGNOSIS — I83891 Varicose veins of right lower extremities with other complications: Secondary | ICD-10-CM

## 2015-07-23 NOTE — Progress Notes (Signed)
Subjective:     Patient ID: Lauren Solis, female   DOB: 03-11-1986, 29 y.o.   MRN: [id number]  HPI this 29 year old female returns 1 week post-laser ablation right great saphenous vein plus multiple stab phlebectomy of painful varicosities performed under local tumescent anesthesia. She has had some moderate discomfort in the distal thigh overlying the great saphenous vein which is starting to improve. She denies any chest pain, dyspnea on exertion, PND, orthopnea, or hemoptysis. She is wearing her elastic compression stocking and taking ibuprofen as instructed.   Review of Systems     Objective:   Physical Exam BP 112/82 mmHg  Pulse 67  Temp(Src) 97.2 F (36.2 C)  Resp 14  Ht 5\' 6"  (1.676 m)  Wt 165 lb (74.844 kg)  BMI 26.64 kg/m2  SpO2 98%  Gen. well-developed well-nourished female in no apparent distress alert and oriented 3 Lungs no rhonchi or wheezing Right leg with moderate discomfort in the very distal thigh over the great saphenous vein. No blistering or erythema noted. No distal edema. Stab phlebectomy sites healing nicely.  Today I ordered a venous duplex exam of the right leg which I reviewed and interpreted. There is no DVT. 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  I performed a independent sinusitis exam of the right small saphenous vein today. He does have reflux but the caliber is not large enough at this point to require ablation.     Assessment:     Successful laser ablation right great saphenous vein with multiple stab phlebectomy of painful varicosities with good early result    Plan:     Return to see me on when necessary basis Patient does have gross reflux and a large great saphenous vein on the left side but this is asymptomatic currently and also has reflux in the right small saphenous vein which is not large caliber at the present time If further symptoms develop in the future these may become  issues at that time.

## 2015-11-14 ENCOUNTER — Encounter: Payer: Self-pay | Admitting: Family Medicine

## 2015-11-14 ENCOUNTER — Ambulatory Visit (INDEPENDENT_AMBULATORY_CARE_PROVIDER_SITE_OTHER): Payer: Managed Care, Other (non HMO) | Admitting: Family Medicine

## 2015-11-14 VITALS — BP 113/74 | HR 64 | Temp 97.9°F | Resp 20 | Wt 158.8 lb

## 2015-11-14 DIAGNOSIS — N61 Mastitis without abscess: Secondary | ICD-10-CM | POA: Diagnosis not present

## 2015-11-14 MED ORDER — CEPHALEXIN 500 MG PO CAPS: 500.0000 mg | ORAL_CAPSULE | Freq: Four times a day (QID) | ORAL | Status: DC

## 2015-11-14 NOTE — Progress Notes (Signed)
Subjective:    Patient ID: Lauren Solis, female    DOB: 1986-02-23, 29 y.o.   MRN: [id number]  HPI  Right breast pain: Patient presents for a one-day history of right breast pain. She states last night she noticed when rolling over in bed, when she was moving her arm she felt tenderness on the outside of her breast. Patient is nursing her 86-month-old son. She states she is starting to eat less from the breast, and she thought maybe she was engorged. She states that the pain did not improve after breast-feeding. She denies any pain with the action of breast-feeding, and left the area is physically touched. She denies current fever or chills, but states 2-3 days ago she had a fever, chills and felt fatigued. She denies any skin changes, abnormal nipple discharge, abnormal milk production or color, bleeding or cracked nipples. Patient states her menstrual cycle was approximately 2 weeks ago. She has a maternal aunt that had breast cancer around late 21s to 59s. Patient has a history DVT after her delivery.  Past Medical History  Diagnosis Date  . Medical history non-contributory   . Varicose veins   . DVT (deep venous thrombosis) (HCC)     No Known Allergies  Family History  Problem Relation Age of Onset  . Cancer Maternal Aunt     Breast  . Diabetes Maternal Aunt   . Diabetes Maternal Uncle   . Kidney disease Maternal Grandmother    Social History   Social History  . Marital Status: Married    Spouse Name: N/A  . Number of Children: N/A  . Years of Education: N/A   Occupational History  . Not on file.   Social History Main Topics  . Smoking status: Never Smoker   . Smokeless tobacco: Never Used  . Alcohol Use: No  . Drug Use: No  . Sexual Activity: Yes    Birth Control/ Protection: None   Other Topics Concern  . Not on file   Social History Narrative    Review of Systems Negative, with the exception of above mentioned in HPI    Objective:   Physical Exam BP  113/74 mmHg  Pulse 64  Temp(Src) 97.9 F (36.6 C) (Oral)  Resp 20  Wt 158 lb 12 oz (72.009 kg)  SpO2 95%  LMP 10/29/2015 Gen: Afebrile. No acute distress. Nontoxic in appearance, well-developed, well-nourished, African-American female. Eyes:Pupils Equal Round Reactive to light, Extraocular movements intact,  Conjunctiva without redness, discharge or icterus. Neck/lymp/endocrine: Supple, no lymphadenopathy CV: RRR  Chest: CTAB, no wheeze or crackles Skin: No rashes, purpura or petechiae.  Breast: Lactating. No erythema, no skin changes, no abnormal nipple discharge bilateral breast. left breast normal without mass, skin or nipple changes or axillary nodes, mild fibrocystic densities.  Right breast with tenderness upper outer quadrant, multiple fibrocystic densities versus inflammatory lower axillary lymph nodes, with approximately 2 cm discrete mobile mass, tender to palpation.     Assessment & Plan:  1. Acute mastitis of right breast - Suspect mild case of acute mastitis. Patient's child is 65-month-old and is breast-feeding less. She did experience some fatigue and fever a few days ago. Will treat as if mastitis with Keflex for 10 days. Encouraged warm compresses/hot showers. Patient was encouraged to continue breast-feeding. - Patient does have a family history of breast cancer in her maternal aunt. Discussed with her I would like to follow-up on Monday if she is not having improvement in her symptoms, would obtain imaging  studies at that time. - cephALEXin (KEFLEX) 500 MG capsule; Take 1 capsule (500 mg total) by mouth 4 (four) times daily.  Dispense: 40 capsule; Refill: 0 Follow-up 5 days.

## 2015-11-14 NOTE — Patient Instructions (Signed)
Breastfeeding and Mastitis Mastitis is inflammation of the breast tissue. It can occur in women who are breastfeeding. This can make breastfeeding painful. Mastitis will sometimes go away on its own. Your health care provider will help determine if treatment is needed. CAUSES Mastitis is often associated with a blocked milk (lactiferous) duct. This can happen when too much milk builds up in the breast. Causes of excess milk in the breast can include:  Poor latch-on. If your baby is not latched onto the breast properly, she or he may not empty your breast completely while breastfeeding.  Allowing too much time to pass between feedings.  Wearing a bra or other clothing that is too tight. This puts extra pressure on the lactiferous ducts so milk does not flow through them as it should. Mastitis can also be caused by a bacterial infection. Bacteria may enter the breast tissue through cuts or openings in the skin. In women who are breastfeeding, this may occur because of cracked or irritated skin. Cracks in the skin are often caused when your baby does not latch on properly to the breast. SIGNS AND SYMPTOMS  Swelling, redness, tenderness, and pain in an area of the breast.  Swelling of the glands under the arm on the same side.  Fever may or may not accompany mastitis. If an infection is allowed to progress, a collection of pus (abscess) may develop. DIAGNOSIS  Your health care provider can usually diagnose mastitis based on your symptoms and a physical exam. Tests may be done to help confirm the diagnosis. These may include:  Removal of pus from the breast by applying pressure to the area. This pus can be examined in the lab to determine which bacteria are present. If an abscess has developed, the fluid in the abscess can be removed with a needle. This can also be used to confirm the diagnosis and determine the bacteria present. In most cases, pus will not be present.  Blood tests to determine if  your body is fighting a bacterial infection.  Mammogram or ultrasound tests to rule out other problems or diseases. TREATMENT  Mastitis that occurs with breastfeeding will sometimes go away on its own. Your health care provider may choose to wait 24 hours after first seeing you to decide whether a prescription medicine is needed. If your symptoms are worse after 24 hours, your health care provider will likely prescribe an antibiotic medicine to treat the mastitis. He or she will determine which bacteria are most likely causing the infection and will then select an appropriate antibiotic medicine. This is sometimes changed based on the results of tests performed to identify the bacteria, or if there is no response to the antibiotic medicine selected. Antibiotic medicines are usually given by mouth. You may also be given medicine for pain. HOME CARE INSTRUCTIONS  Only take over-the-counter or prescription medicines for pain, fever, or discomfort as directed by your health care provider.  If your health care provider prescribed an antibiotic medicine, take the medicine as directed. Make sure you finish it even if you start to feel better.  Do not wear a tight or underwire bra. Wear a soft, supportive bra.  Increase your fluid intake, especially if you have a fever.  Continue to empty the breast. Your health care provider can tell you whether this milk is safe for your infant or needs to be thrown out. You may be told to stop nursing until your health care provider thinks it is safe for your baby.   Use a breast pump if you are advised to stop nursing.  Keep your nipples clean and dry.  Empty the first breast completely before going to the other breast. If your baby is not emptying your breasts completely for some reason, use a breast pump to empty your breasts.  If you go back to work, pump your breasts while at work to stay in time with your nursing schedule.  Avoid allowing your breasts to become  overly filled with milk (engorged). SEEK MEDICAL CARE IF:  You have pus-like discharge from the breast.  Your symptoms do not improve with the treatment prescribed by your health care provider within 2 days. SEEK IMMEDIATE MEDICAL CARE IF:  Your pain and swelling are getting worse.  You have pain that is not controlled with medicine.  You have a red line extending from the breast toward your armpit.  You have a fever or persistent symptoms for more than 2-3 days.  You have a fever and your symptoms suddenly get worse. MAKE SURE YOU:   Understand these instructions.  Will watch your condition.  Will get help right away if you are not doing well or get worse.   This information is not intended to replace advice given to you by your health care provider. Make sure you discuss any questions you have with your health care provider.  I want to see you Monday. If no improvement in symptoms will schedule Ultrasound of the area.    Document Released: 04/04/2005 Document Revised: 12/13/2013 Document Reviewed: 07/14/2013 Elsevier Interactive Patient Education [employer].

## 2016-02-13 ENCOUNTER — Ambulatory Visit (INDEPENDENT_AMBULATORY_CARE_PROVIDER_SITE_OTHER): Payer: 59 | Admitting: Internal Medicine

## 2016-02-13 ENCOUNTER — Encounter: Payer: Self-pay | Admitting: Internal Medicine

## 2016-02-13 VITALS — BP 114/72 | HR 84 | Temp 98.4°F | Wt 156.0 lb

## 2016-02-13 DIAGNOSIS — J069 Acute upper respiratory infection, unspecified: Secondary | ICD-10-CM

## 2016-02-13 MED ORDER — HYDROCODONE-HOMATROPINE 5-1.5 MG/5ML PO SYRP: 5.0000 mL | ORAL_SOLUTION | Freq: Three times a day (TID) | ORAL | Status: DC | PRN

## 2016-02-13 NOTE — Patient Instructions (Signed)
Upper Respiratory Infection, Adult Most upper respiratory infections (URIs) are a viral infection of the air passages leading to the lungs. A URI affects the nose, throat, and upper air passages. The most common type of URI is nasopharyngitis and is typically referred to as "the common cold." URIs run their course and usually go away on their own. Most of the time, a URI does not require medical attention, but sometimes a bacterial infection in the upper airways can follow a viral infection. This is called a secondary infection. Sinus and middle ear infections are common types of secondary upper respiratory infections. Bacterial pneumonia can also complicate a URI. A URI can worsen asthma and chronic obstructive pulmonary disease (COPD). Sometimes, these complications can require emergency medical care and may be life threatening.  CAUSES Almost all URIs are caused by viruses. A virus is a type of germ and can spread from one person to another.  RISKS FACTORS You may be at risk for a URI if:   You smoke.   You have chronic heart or lung disease.  You have a weakened defense (immune) system.   You are very young or very old.   You have nasal allergies or asthma.  You work in crowded or poorly ventilated areas.  You work in health care facilities or schools. SIGNS AND SYMPTOMS  Symptoms typically develop 2-3 days after you come in contact with a cold virus. Most viral URIs last 7-10 days. However, viral URIs from the influenza virus (flu virus) can last 14-18 days and are typically more severe. Symptoms may include:   Runny or stuffy (congested) nose.   Sneezing.   Cough.   Sore throat.   Headache.   Fatigue.   Fever.   Loss of appetite.   Pain in your forehead, behind your eyes, and over your cheekbones (sinus pain).  Muscle aches.  DIAGNOSIS  Your health care provider may diagnose a URI by:  Physical exam.  Tests to check that your symptoms are not due to  another condition such as:  Strep throat.  Sinusitis.  Pneumonia.  Asthma. TREATMENT  A URI goes away on its own with time. It cannot be cured with medicines, but medicines may be prescribed or recommended to relieve symptoms. Medicines may help:  Reduce your fever.  Reduce your cough.  Relieve nasal congestion. HOME CARE INSTRUCTIONS   Take medicines only as directed by your health care provider.   Gargle warm saltwater or take cough drops to comfort your throat as directed by your health care provider.  Use a warm mist humidifier or inhale steam from a shower to increase air moisture. This may make it easier to breathe.  Drink enough fluid to keep your urine clear or pale yellow.   Eat soups and other clear broths and maintain good nutrition.   Rest as needed.   Return to work when your temperature has returned to normal or as your health care provider advises. You may need to stay home longer to avoid infecting others. You can also use a face mask and careful hand washing to prevent spread of the virus.  Increase the usage of your inhaler if you have asthma.   Do not use any tobacco products, including cigarettes, chewing tobacco, or electronic cigarettes. If you need help quitting, ask your health care provider. PREVENTION  The best way to protect yourself from getting a cold is to practice good hygiene.   Avoid oral or hand contact with people with cold   symptoms.   Wash your hands often if contact occurs.  There is no clear evidence that vitamin C, vitamin E, echinacea, or exercise reduces the chance of developing a cold. However, it is always recommended to get plenty of rest, exercise, and practice good nutrition.  SEEK MEDICAL CARE IF:   You are getting worse rather than better.   Your symptoms are not controlled by medicine.   You have chills.  You have worsening shortness of breath.  You have brown or red mucus.  You have yellow or brown nasal  discharge.  You have pain in your face, especially when you bend forward.  You have a fever.  You have swollen neck glands.  You have pain while swallowing.  You have white areas in the back of your throat. SEEK IMMEDIATE MEDICAL CARE IF:   You have severe or persistent:  Headache.  Ear pain.  Sinus pain.  Chest pain.  You have chronic lung disease and any of the following:  Wheezing.  Prolonged cough.  Coughing up blood.  A change in your usual mucus.  You have a stiff neck.  You have changes in your:  Vision.  Hearing.  Thinking.  Mood. MAKE SURE YOU:   Understand these instructions.  Will watch your condition.  Will get help right away if you are not doing well or get worse.   This information is not intended to replace advice given to you by your health care provider. Make sure you discuss any questions you have with your health care provider.   Document Released: 06/03/2001 Document Revised: 04/24/2015 Document Reviewed: 03/15/2014 Elsevier Interactive Patient Education 2016 Elsevier Inc[employer]  

## 2016-02-13 NOTE — Progress Notes (Signed)
Pre visit review using our clinic review tool, if applicable. No additional management support is needed unless otherwise documented below in the visit note. 

## 2016-02-13 NOTE — Progress Notes (Signed)
HPI  Pt presents to the clinic today with c/o headache, runny nose, sore throat and cough. This started 4 days ago. She is blowing clear mucous out of her nose. She denies difficulty swallowing. The cough is non productive. She has run fevers as high as 102. She denies chills or body aches but has been fatigued. She has no history of allergies or breathing problems. She has not had sick contacts. She does not take the flu shot.  Review of Systems      Past Medical History  Diagnosis Date  . Medical history non-contributory   . Varicose veins   . DVT (deep venous thrombosis) (HCC)     Family History  Problem Relation Age of Onset  . Cancer Maternal Aunt     Breast  . Diabetes Maternal Aunt   . Diabetes Maternal Uncle   . Kidney disease Maternal Grandmother     Social History   Social History  . Marital Status: Married    Spouse Name: N/A  . Number of Children: N/A  . Years of Education: N/A   Occupational History  . Not on file.   Social History Main Topics  . Smoking status: Never Smoker   . Smokeless tobacco: Never Used  . Alcohol Use: No  . Drug Use: No  . Sexual Activity: Yes    Birth Control/ Protection: None   Other Topics Concern  . Not on file   Social History Narrative    No Known Allergies   Constitutional: Positive headache, fatigue and fever. Denies abrupt weight changes.  HEENT:  Positive runny nose, sore throat. Denies eye redness, eye pain, pressure behind the eyes, facial pain, nasal congestion, ear pain, ringing in the ears, wax buildup, or bloody nose. Respiratory: Positive cough. Denies difficulty breathing or shortness of breath.  Cardiovascular: Denies chest pain, chest tightness, palpitations or swelling in the hands or feet.   No other specific complaints in a complete review of systems (except as listed in HPI above).  Objective:   BP 114/72 mmHg  Pulse 84  Temp(Src) 98.4 F (36.9 C) (Oral)  Wt 156 lb (70.761 kg)  SpO2 98%  LMP  01/15/2016  Wt Readings from Last 3 Encounters:  02/13/16 156 lb (70.761 kg)  11/14/15 158 lb 12 oz (72.009 kg)  07/23/15 165 lb (74.844 kg)     General: Appears her stated age, well developed, well nourished in NAD. HEENT: Head: normal shape and size, mild frontal sinus pressure noted; Eyes: sclera white, no icterus, conjunctiva pink; Ears: Tm's pink but intact, normal light reflex, + serous effusion bilaterally; Nose: mucosa pink and moist, septum midline; Throat/Mouth: + PND. Teeth present, mucosa pink and moist, tonsils 2+ (normal for her) no exudate noted, no lesions or ulcerations noted.  Neck: No cervical lymphadenopathy.  Cardiovascular: Normal rate and rhythm. S1,S2 noted.  No murmur, rubs or gallops noted.  Pulmonary/Chest: Normal effort and positive vesicular breath sounds. No respiratory distress. No wheezes, rales or ronchi noted.      Assessment & Plan:   Upper Respiratory Infection:  Get some rest and drink plenty of water Do salt water gargles for the sore throat Flonase BID x 3 days then daily x 1 week Rx for Hycodan cough syrup  RTC as needed or if symptoms persist.

## 2017-09-01 ENCOUNTER — Encounter: Payer: Self-pay | Admitting: Internal Medicine

## 2017-09-01 ENCOUNTER — Ambulatory Visit (INDEPENDENT_AMBULATORY_CARE_PROVIDER_SITE_OTHER): Payer: Medicaid Other | Admitting: Internal Medicine

## 2017-09-01 VITALS — BP 116/70 | HR 68 | Temp 97.9°F | Ht 66.0 in | Wt 157.5 lb

## 2017-09-01 DIAGNOSIS — L659 Nonscarring hair loss, unspecified: Secondary | ICD-10-CM

## 2017-09-01 DIAGNOSIS — Z0000 Encounter for general adult medical examination without abnormal findings: Secondary | ICD-10-CM

## 2017-09-01 DIAGNOSIS — L603 Nail dystrophy: Secondary | ICD-10-CM | POA: Diagnosis not present

## 2017-09-01 DIAGNOSIS — Z124 Encounter for screening for malignant neoplasm of cervix: Secondary | ICD-10-CM

## 2017-09-01 LAB — CBC
HCT: 40.2 % (ref 36.0–46.0)
Hemoglobin: 13.8 g/dL (ref 12.0–15.0)
MCHC: 34.4 g/dL (ref 30.0–36.0)
MCV: 76.6 fl — AB (ref 78.0–100.0)
PLATELETS: 266 10*3/uL (ref 150.0–400.0)
RBC: 5.25 Mil/uL — AB (ref 3.87–5.11)
RDW: 16.6 % — ABNORMAL HIGH (ref 11.5–15.5)
WBC: 4.4 10*3/uL (ref 4.0–10.5)

## 2017-09-01 LAB — COMPREHENSIVE METABOLIC PANEL
ALBUMIN: 4.1 g/dL (ref 3.5–5.2)
ALK PHOS: 50 U/L (ref 39–117)
ALT: 10 U/L (ref 0–35)
AST: 15 U/L (ref 0–37)
BUN: 13 mg/dL (ref 6–23)
CALCIUM: 9.5 mg/dL (ref 8.4–10.5)
CO2: 26 mEq/L (ref 19–32)
Chloride: 103 mEq/L (ref 96–112)
Creatinine, Ser: 0.94 mg/dL (ref 0.40–1.20)
GFR: 88.82 mL/min (ref >60.00)
Glucose, Bld: 89 mg/dL (ref 70–99)
Potassium: 4 mEq/L (ref 3.5–5.1)
SODIUM: 137 meq/L (ref 135–145)
Total Bilirubin: 0.9 mg/dL (ref 0.2–1.2)
Total Protein: 7.4 g/dL (ref 6.0–8.3)

## 2017-09-01 LAB — LIPID PANEL
CHOLESTEROL: 168 mg/dL (ref 0–200)
HDL: 53 mg/dL (ref >39.00)
LDL Cholesterol: 103 mg/dL — ABNORMAL HIGH (ref 0–99)
NonHDL: 115.26
Total CHOL/HDL Ratio: 3
Triglycerides: 62 mg/dL (ref 0.0–149.0)
VLDL: 12.4 mg/dL (ref 0.0–40.0)

## 2017-09-01 LAB — IBC PANEL
IRON: 31 ug/dL — AB (ref 42–145)
Saturation Ratios: 7.2 % — ABNORMAL LOW (ref 20.0–50.0)
Transferrin: 307 mg/dL (ref 212.0–360.0)

## 2017-09-01 LAB — TSH: TSH: 1.44 u[IU]/mL (ref 0.35–4.50)

## 2017-09-01 NOTE — Progress Notes (Signed)
Subjective:    Patient ID: Lauren Solis, female    DOB: 1986-10-08, 31 y.o.   MRN: [id number]  HPI  Pt presents to the clinic today for her annual exam.   Flu: never Tetanus: ? 2010 Pap Smear: ? 2016 Dentist: annually  Diet: She does eat meat. She consumes more veggies than fruits. She occassionally eats fried foods. She drinks mostly soda, some water. Exercise: She does exercise classes for 1 hour, 5 days per week   Review of Systems  Past Medical History:  Diagnosis Date  . DVT (deep venous thrombosis) ([place])   . Medical history non-contributory   . Varicose veins     Current Outpatient Prescriptions  Medication Sig Dispense Refill  . Multiple Vitamins-Minerals (HAIR VITAMINS PO) Take 2 capsules by mouth daily.     No current facility-administered medications for this visit.     No Known Allergies  Family History  Problem Relation Age of Onset  . Cancer Maternal Aunt        Breast  . Diabetes Maternal Aunt   . Diabetes Maternal Uncle   . Kidney disease Maternal Grandmother     Social History   Social History  . Marital status: Married    Spouse name: N/A  . Number of children: N/A  . Years of education: N/A   Occupational History  . Not on file.   Social History Main Topics  . Smoking status: Never Smoker  . Smokeless tobacco: Never Used  . Alcohol use No  . Drug use: No  . Sexual activity: Yes    Birth control/ protection: None   Other Topics Concern  . Not on file   Social History Narrative  . No narrative on file     Constitutional: Denies fever, malaise, fatigue, headache or abrupt weight changes.  HEENT: Pt reports thinning hair. Denies eye pain, eye redness, ear pain, ringing in the ears, wax buildup, runny nose, nasal congestion, bloody nose, or sore throat. Respiratory: Denies difficulty breathing, shortness of breath, cough or sputum production.   Cardiovascular: Denies chest pain, chest tightness, palpitations or swelling in the  hands or feet.  Gastrointestinal: Denies abdominal pain, bloating, constipation, diarrhea or blood in the stool.  GU: Denies urgency, frequency, pain with urination, burning sensation, blood in urine, odor or discharge. Musculoskeletal: Denies decrease in range of motion, difficulty with gait, muscle pain or joint pain and swelling.  Skin: Pt reports bruise to right lower leg. Denies redness, rashes, lesions or ulcercations.  Neurological: Denies dizziness, difficulty with memory, difficulty with speech or problems with balance and coordination.  Psych: Denies anxiety, depression, SI/HI.  No other specific complaints in a complete review of systems (except as listed in HPI above).     Objective:   Physical Exam    BP 116/70   Pulse 68   Temp 97.9 F (36.6 C) (Oral)   Ht 5\' 6"  (1.676 m)   Wt 157 lb 8 oz (71.4 kg)   LMP 08/18/2017   SpO2 98%   BMI 25.42 kg/m  Wt Readings from Last 3 Encounters:  09/01/17 157 lb 8 oz (71.4 kg)  02/13/16 156 lb (70.8 kg)  11/14/15 158 lb 12 oz (72 kg)    General: Appears her stated age, well developed, well nourished in NAD. Skin: Warm, dry and intact. 4 cm x 3 cm area of discoloration noted on right lateral lower leg. HEENT: Head: normal shape and size; Eyes: sclera white, no icterus, conjunctiva pink, PERRLA and  EOMs intact; Ears: Tm's gray and intact, normal light reflex;  Throat/Mouth: Teeth present, mucosa pink and moist, no exudate, lesions or ulcerations noted.  Neck:  Neck supple, trachea midline. No masses, lumps or thyromegaly present.  Cardiovascular: Normal rate and rhythm. S1,S2 noted.  No murmur, rubs or gallops noted. No JVD or BLE edema.  Pulmonary/Chest: Normal effort and positive vesicular breath sounds. No respiratory distress. No wheezes, rales or ronchi noted.  Abdomen: Soft and nontender. Normal bowel sounds. No distention or masses noted. Liver, spleen and kidneys non palpable. Pelvic: Normal female anatomy. Cervix with  changes noted. Light green discharge noted from the cervix. No CMT. Adnexa non palpable.  Musculoskeletal: Strength 5/ 5BUE/BLE. No difficulty with gait.  Neurological: Alert and oriented. Cranial nerves II-XII grossly intact. Coordination normal.  Psychiatric: Mood and affect normal. Behavior is normal. Judgment and thought content normal.     BMET    Component Value Date/Time   NA 138 02/17/2015 1613   K 4.1 02/17/2015 1613   CL 111 02/17/2015 1613   CO2 25 02/17/2015 1613   GLUCOSE 83 02/17/2015 1613   BUN 15 02/17/2015 1613   CREATININE 0.71 02/17/2015 1613   CALCIUM 8.6 02/17/2015 1613   GFRNONAA >90 02/17/2015 1613   GFRAA >90 02/17/2015 1613    Lipid Panel     Component Value Date/Time   CHOL 166 05/05/2014 1001   TRIG 34.0 05/05/2014 1001   HDL 60.80 05/05/2014 1001   CHOLHDL 3 05/05/2014 1001   VLDL 6.8 05/05/2014 1001   LDLCALC 98 05/05/2014 1001    CBC    Component Value Date/Time   WBC 5.3 02/17/2015 1613   RBC 4.54 02/17/2015 1613   HGB 10.5 (L) 02/17/2015 1613   HCT 30.8 (L) 02/17/2015 1613   PLT 216 02/17/2015 1613   MCV 67.8 (L) 02/17/2015 1613   MCH 23.1 (L) 02/17/2015 1613   MCHC 34.1 02/17/2015 1613   RDW 18.2 (H) 02/17/2015 1613   LYMPHSABS 1.5 11/07/2014 1010   MONOABS 0.6 11/07/2014 1010   EOSABS 0.1 11/07/2014 1010   BASOSABS 0.1 11/07/2014 1010    Hgb A1C No results found for: HGBA1C        Assessment & Plan:   Preventative Health Maintenance:  Encouraged her to get a flu shot in the fall She declines tetanus booster today Pap smear today, she declines STD screening Encouraged her to consume a balanced diet and exercise regimen Advised her to see a dentist annually Will check CBC, CMET, Lipid and TSH today  Hair Loss, Brittle Nails:  Will check TSH and IBC panel per request  Advised her she will need to seek care at the [facility] since she has Medicaid Webb Silversmith, NP

## 2017-09-01 NOTE — Patient Instructions (Signed)

## 2017-09-02 ENCOUNTER — Other Ambulatory Visit (HOSPITAL_COMMUNITY)
Admission: RE | Admit: 2017-09-02 | Discharge: 2017-09-02 | Disposition: A | Discharge status: Home / Self Care | Payer: Medicaid Other | Source: Ambulatory Visit | Attending: Internal Medicine | Admitting: Internal Medicine

## 2017-09-02 DIAGNOSIS — Z124 Encounter for screening for malignant neoplasm of cervix: Secondary | ICD-10-CM | POA: Insufficient documentation

## 2017-09-02 NOTE — Addendum Note (Signed)
Addended by: Lurlean Nanny on: 09/02/2017 03:58 PM   Modules accepted: Orders

## 2017-09-04 LAB — CYTOLOGY - PAP
DIAGNOSIS: NEGATIVE
HPV (WINDOPATH): NOT DETECTED

## 2018-02-09 ENCOUNTER — Ambulatory Visit (INDEPENDENT_AMBULATORY_CARE_PROVIDER_SITE_OTHER): Payer: BLUE CROSS/BLUE SHIELD | Admitting: Internal Medicine

## 2018-02-09 ENCOUNTER — Encounter: Payer: Self-pay | Admitting: Internal Medicine

## 2018-02-09 VITALS — BP 114/78 | HR 68 | Temp 98.1°F | Wt 158.0 lb

## 2018-02-09 DIAGNOSIS — R102 Pelvic and perineal pain: Secondary | ICD-10-CM | POA: Diagnosis not present

## 2018-02-09 DIAGNOSIS — K6289 Other specified diseases of anus and rectum: Secondary | ICD-10-CM

## 2018-02-09 MED ORDER — HYDROCORTISONE ACETATE 25 MG RE SUPP: 25.0000 mg | Freq: Two times a day (BID) | RECTAL | 0 refills | Status: DC

## 2018-02-09 NOTE — Progress Notes (Signed)
Subjective:    Patient ID: Lauren Solis, female    DOB: [birth year], 32 y.o.   MRN: [id number]  HPI  Pt presents to the clinic today with c/o lower abdominal pain and rectal pain. She reports this actually started 1 week ago. She had vaginal intercourse, was fine. Shortly after, she noticed sharp, stabbing pain in her pelvic region, accompanied by sharp, stabbing anal pain. The pain does not radiate. She denies vaginal discharge, odor, urinary frequency, dysuria or blood in her urine. She denies anal itching, constipation or blood in her stool. She has to sit down very slowly because of the anal pain. She is having normal bowel movements, but has not had one today. She is scared to have a BM because she is scare it is going to hurt. She does have a history of hemorrhoids, but has not had many problems from them. She is currently on her menstrual cycle.  Review of Systems  Past Medical History:  Diagnosis Date  . DVT (deep venous thrombosis) ([place])   . Medical history non-contributory   . Varicose veins     Current Outpatient Medications  Medication Sig Dispense Refill  . hydrocortisone (ANUSOL-HC) 25 MG suppository Place 1 suppository (25 mg total) rectally 2 (two) times daily. 12 suppository 0  . Multiple Vitamins-Minerals (HAIR VITAMINS PO) Take 2 capsules by mouth daily.     No current facility-administered medications for this visit.     No Known Allergies  Family History  Problem Relation Age of Onset  . Cancer Maternal Aunt        Breast  . Diabetes Maternal Aunt   . Diabetes Maternal Uncle   . Kidney disease Maternal Grandmother     Social History   Socioeconomic History  . Marital status: Married    Spouse name: Not on file  . Number of children: Not on file  . Years of education: Not on file  . Highest education level: Not on file  Social Needs  . Financial resource strain: Not on file  . Food insecurity - worry: Not on file  . Food insecurity - inability: Not  on file  . Transportation needs - medical: Not on file  . Transportation needs - non-medical: Not on file  Occupational History  . Not on file  Tobacco Use  . Smoking status: Never Smoker  . Smokeless tobacco: Never Used  Substance and Sexual Activity  . Alcohol use: No    Alcohol/week: 0.0 oz  . Drug use: No  . Sexual activity: Yes    Birth control/protection: None  Other Topics Concern  . Not on file  Social History Narrative  . Not on file     Constitutional: Denies fever, malaise, fatigue, headache or abrupt weight changes.  Gastrointestinal: Pt reports pelvic and rectal pain. Deniesbloating, constipation, diarrhea or blood in the stool.  GU: Denies urgency, frequency, pain with urination, burning sensation, blood in urine, odor or discharge.  No other specific complaints in a complete review of systems (except as listed in HPI above).     Objective:   Physical Exam   BP 114/78   Pulse 68   Temp 98.1 F (36.7 C) (Oral)   Wt 158 lb (71.7 kg)   LMP 02/08/2018   SpO2 98%   BMI 25.50 kg/m  Wt Readings from Last 3 Encounters:  02/09/18 158 lb (71.7 kg)  09/01/17 157 lb 8 oz (71.4 kg)  02/13/16 156 lb (70.8 kg)    General: Appears  her stated age, well developed, well nourished in NAD. Abdomen: Soft and nontender. Hypoactive bowel sounds. No distention or masses noted.  Pelvic: Deferred as she is on her menstrual cycle. Rectal: External hemorrhoids noted. Pain with internal rectal exam, but no mass or bleeding noted.   BMET    Component Value Date/Time   NA 137 09/01/2017 0859   K 4.0 09/01/2017 0859   CL 103 09/01/2017 0859   CO2 26 09/01/2017 0859   GLUCOSE 89 09/01/2017 0859   BUN 13 09/01/2017 0859   CREATININE 0.94 09/01/2017 0859   CALCIUM 9.5 09/01/2017 0859   GFRNONAA >90 02/17/2015 1613   GFRAA >90 02/17/2015 1613    Lipid Panel     Component Value Date/Time   CHOL 168 09/01/2017 0859   TRIG 62.0 09/01/2017 0859   HDL 53.00 09/01/2017  0859   CHOLHDL 3 09/01/2017 0859   VLDL 12.4 09/01/2017 0859   LDLCALC 103 (H) 09/01/2017 0859    CBC    Component Value Date/Time   WBC 4.4 09/01/2017 0859   RBC 5.25 (H) 09/01/2017 0859   HGB 13.8 09/01/2017 0859   HCT 40.2 09/01/2017 0859   PLT 266.0 09/01/2017 0859   MCV 76.6 (L) 09/01/2017 0859   MCH 23.1 (L) 02/17/2015 1613   MCHC 34.4 09/01/2017 0859   RDW 16.6 (H) 09/01/2017 0859   LYMPHSABS 1.5 11/07/2014 1010   MONOABS 0.6 11/07/2014 1010   EOSABS 0.1 11/07/2014 1010   BASOSABS 0.1 11/07/2014 1010    Hgb A1C No results found for: HGBA1C         Assessment & Plan:   Rectal Pain:  ? Internal hemorrhoid Will try Anusol HC suppositories  Pelvic Pain:  We will wait an see if this improves after her menstrual cycle If not, will do pelvic exam Can try Ibuprofen and a heating pad to see if that might help  Return precautions discussed Webb Silversmith, NP

## 2018-02-09 NOTE — Patient Instructions (Signed)
Proctalgia Fugax Proctalgia fugax is a condition that involves very short episodes of intense pain in the rectum. The rectum is the last part of the large intestine. The pain can last from seconds to minutes. Episodes often occur during the night and awaken the person from sleep. This condition is not a sign of cancer, but your health care provider may want to rule out a number of other conditions. What are the causes? The cause of this condition is not known. One possible cause may be spasm of the pelvic muscles or of the lowest part of the large intestine. What are the signs or symptoms? The only symptom of this condition is rectal pain.  The pain may be intense or severe.  The pain may last for only a few seconds or it may last up to 30 minutes.  The pain may occur at night and wake you up from sleep.  How is this diagnosed? This condition may be diagnosed by ruling out other problems that could cause the pain. Diagnosis may include:  Medical history and physical exam.  Various tests, such as: ? Anoscopy. In this test, a lighted scope is put into the rectum to look for abnormalities. ? Barium enema. In this test, X-rays are taken after a white chalky substance called barium is put into the colon. The barium makes it easier to see problems because it shows up well on the X-rays. ? Blood tests to rule out infections or other problems.  How is this treated? There is no specific treatment to cure this condition. Treatment options may include:  Medicines.  Warm baths.  Relaxation techniques.  Gentle massage of the painful area.  Biofeedback.  Follow these instructions at home:  Take over-the-counter and prescription medicines only as told by your health care provider.  Follow instructions from your health care provider about diet.  Follow instructions from your health care provider about rest and physical activity.  Try warm baths, massaging the area, or progressive  relaxation techniques as told by your health care provider.  Keep all follow-up visits as told by your health care provider. This is important. Contact a health care provider if:  You develop new symptoms.  Your pain does not get better as soon as it usually does. This information is not intended to replace advice given to you by your health care provider. Make sure you discuss any questions you have with your health care provider. Document Released: 09/02/2001 Document Revised: 05/15/2016 Document Reviewed: 03/05/2015 Elsevier Interactive Patient Education  2018 Elsevier Inc.  

## 2018-02-11 ENCOUNTER — Other Ambulatory Visit: Payer: Self-pay | Admitting: Internal Medicine

## 2018-02-11 ENCOUNTER — Telehealth: Payer: Self-pay | Admitting: Internal Medicine

## 2018-02-11 MED ORDER — HYDROCORTISONE ACETATE 30 MG RE SUPP: 1.0000 | Freq: Two times a day (BID) | RECTAL | 0 refills | Status: DC | PRN

## 2018-02-11 NOTE — Telephone Encounter (Signed)
Left detailed msg on VM per HIPAA  

## 2018-02-11 NOTE — Telephone Encounter (Signed)
Copied from [place] [id number]. Topic: Quick Communication - See Telephone Encounter >> Feb 11, 2018 12:06 PM Aurelio Brash B wrote: CRM for notification. See Telephone encounter for:  PTs husband called to say that the insurance will not pay for the hydrocortisone (ANUSOL-HC) 25 MG suppository  and he is asking if she can get a generic form sent to   [place], [place] [phone] (Phone) [phone] (Fax)    02/11/18

## 2018-02-11 NOTE — Telephone Encounter (Signed)
That was generic. I can try Proctosol to see if that is covered.

## 2018-02-12 NOTE — Telephone Encounter (Signed)
Patient notified as instructed by telephone and verbalized understanding. Patient stated that she would like to try Proctosol and would like that sent to the pharmacy.

## 2018-02-12 NOTE — Telephone Encounter (Signed)
Pt called back - she did not get the message and would like call back about medication [phone]

## 2018-02-17 NOTE — Telephone Encounter (Signed)
There is nothing else. She will have to stick with preparation H OTC or we can refer to GI if needed

## 2018-02-17 NOTE — Telephone Encounter (Signed)
Lauren Solis, husband Call Back [phone]  Neither Anusol or Proctosol are covered by insurance. Pt husband just spoke to ins co and they state that neither med is approved by the FDA and they will not cover. Requesting a different med. Please advise

## 2018-02-17 NOTE — Telephone Encounter (Signed)
Left detailed msg on VM per HIPAA  

## 2018-02-17 NOTE — Telephone Encounter (Signed)
   Neither Anusol or Proctosol are covered by insurance. Pt husband just spoke to ins co and they state that neither med is approved by the FDA and they will not cover. Requesting a different med. Please advise  Call Back:    [phone]

## 2018-02-17 NOTE — Addendum Note (Signed)
Addended by: Lurlean Nanny on: 02/17/2018 05:20 PM   Modules accepted: Orders

## 2018-02-23 ENCOUNTER — Encounter: Payer: Self-pay | Admitting: Physician Assistant

## 2018-02-23 ENCOUNTER — Other Ambulatory Visit: Payer: Self-pay | Admitting: Internal Medicine

## 2018-02-23 DIAGNOSIS — K6289 Other specified diseases of anus and rectum: Secondary | ICD-10-CM

## 2018-02-23 NOTE — Telephone Encounter (Signed)
Patient calling back, would like to move forward with GI, Call back [phone]

## 2018-02-26 ENCOUNTER — Ambulatory Visit (INDEPENDENT_AMBULATORY_CARE_PROVIDER_SITE_OTHER): Payer: BLUE CROSS/BLUE SHIELD | Admitting: Gastroenterology

## 2018-02-26 ENCOUNTER — Encounter: Payer: Self-pay | Admitting: Gastroenterology

## 2018-02-26 ENCOUNTER — Other Ambulatory Visit: Payer: Self-pay

## 2018-02-26 VITALS — BP 119/82 | HR 79 | Temp 98.2°F | Ht 66.0 in | Wt 162.8 lb

## 2018-02-26 DIAGNOSIS — D508 Other iron deficiency anemias: Secondary | ICD-10-CM

## 2018-02-26 DIAGNOSIS — K641 Second degree hemorrhoids: Secondary | ICD-10-CM

## 2018-02-26 NOTE — Progress Notes (Signed)
PROCEDURE NOTE: The patient presents with symptomatic grade 2 hemorrhoids, unresponsive to maximal medical therapy, requesting rubber band ligation of his/her hemorrhoidal disease.  All risks, benefits and alternative forms of therapy were described and informed consent was obtained.  In the Left Lateral Decubitus position (if anoscopy is performed) anoscopic examination revealed grade 2 hemorrhoids in the all position(s).   The decision was made to band the LL internal hemorrhoid, and the [facility] O'Regan System was used to perform band ligation without complication.  Digital anorectal examination was then performed to assure proper positioning of the band, and to adjust the banded tissue as required.  The patient was discharged home without pain or other issues.  Dietary and behavioral recommendations were given and (if necessary - prescriptions were given), along with follow-up instructions.  The patient will return 2 weeks for follow-up and possible additional banding as required.  No complications were encountered and the patient tolerated the procedure well.   

## 2018-02-26 NOTE — Progress Notes (Signed)
Cephas Darby, MD [address]  [place]  [place], [place] [address]  Main: [phone]  Fax: [phone]    Gastroenterology Consultation  Referring Provider:     Jearld Fenton, NP Primary Care Physician:  Jearld Fenton, NP Primary Gastroenterologist:  Dr. Cephas Darby Reason for Consultation:     Symptomatic hemorrhoids        HPI:   Lauren Solis is a 32 y.o. female referred by Dr. Jearld Fenton, NP  for consultation & management of symptomatic hemorrhoids. She reports chronic symptoms of rectal pain, prolapse, rectal pressure, leaking that started after her youngest son who is now 33 years old. For the last 1 month, she has been experiencing rectal pain, pelvic pressure associated with any bowel symptoms. She went to her primary care doctor who suggested to use Preparation H. Patient reports that she has been applying compression and an irregular basis but her symptoms are ongoing. Therefore, she is referred here for hemorrhoid banding ligation. Patient has history of DVT in her right lower extremity that was diagnosed in postpartum. And underwent thrombectomy. She was on anti-coagulation for 6 months to a year. This was in 2016. She is currently not on any anti-cannulation. She was anemic when she was pregnant. Her hemoglobin is back to normal based on labs from 08/2017. She does have mild low MCV. She is currently not on any iron supplementation. She otherwise denies diarrhea, constipation, abdominal pain, weight loss, rectal bleeding. She does have sensation of incomplete emptying and spends about 20 minutes on toilet for a bowel movement.  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 DVT (deep venous  thrombosis) ([place])   . Medical history non-contributory   . Varicose veins     Past Surgical History:  Procedure Laterality Date  . BILATERAL SALPINGECTOMY Bilateral 02/13/2015   Procedure: BILATERAL SALPINGECTOMY;  Surgeon: Alinda Dooms, MD;  Location: [facility];  Service: Gynecology;  Laterality: Bilateral;  . OOPHORECTOMY  2010  . OOPHORECTOMY     not sure which side  . TUBAL LIGATION Bilateral 02/13/2015   Procedure: POST PARTUM TUBAL LIGATION;  Surgeon: Alinda Dooms, MD;  Location: [facility];  Service: Gynecology;  Laterality: Bilateral;    Prior to Admission medications   Medication Sig Start Date End Date Taking? Authorizing Provider  Multiple Vitamins-Minerals (HAIR VITAMINS PO) Take 2 capsules by mouth daily.   Yes [provider]    Family History  Problem Relation Age of Onset  . Cancer Maternal Aunt        Breast  . Diabetes Maternal Aunt   . Diabetes Maternal Uncle   . Kidney disease Maternal Grandmother      Social History   Tobacco Use  . Smoking status: Never Smoker  . Smokeless tobacco: Never Used  Substance Use Topics  . Alcohol use: No    Alcohol/week: 0.0 oz  . Drug use: No    Allergies as of 02/26/2018  . (No Known Allergies)    Review of Systems:    All systems reviewed and negative except where noted in HPI.   Physical Exam:  BP 119/82   Pulse 79   Temp 98.2 F (36.8 C) (Oral)  Ht 5\' 6"  (1.676 m)   Wt 162 lb 12.8 oz (73.8 kg)   LMP 02/08/2018   BMI 26.28 kg/m  Patient's last menstrual period was 02/08/2018.  General:   Alert,  Well-developed, well-nourished, pleasant and cooperative in NAD Head:  Normocephalic and atraumatic. Eyes:  Sclera clear, no icterus.   Conjunctiva pink. Ears:  Normal auditory acuity. Nose:  No deformity, discharge, or lesions. Mouth:  No deformity or lesions,oropharynx pink & moist. Neck:  Supple; no masses or thyromegaly. Lungs:  Respirations even and unlabored.  Clear throughout to  auscultation.   No wheezes, crackles, or rhonchi. No acute distress. Heart:  Regular rate and rhythm; no murmurs, clicks, rubs, or gallops. Abdomen:  Normal bowel sounds. Soft, non-tender and non-distended without masses, hepatosplenomegaly or hernias noted.  No guarding or rebound tenderness.   Rectal: there were no external hemorrhoids outside anal canal, small skin tags are present, distal rectal exam revealed internal and external hemorrhoids, nontender, no evidence of anal fissure. Anoscopy revealed medium size internal and external hemorrhoids. Msk:  Symmetrical without gross deformities. Good, equal movement & strength bilaterally. Pulses:  Normal pulses noted. Extremities:  No clubbing or edema.  No cyanosis. Neurologic:  Alert and oriented x3;  grossly normal neurologically. Skin:  Intact without significant lesions or rashes. No jaundice. Lymph Nodes:  No significant cervical adenopathy. Psych:  Alert and cooperative. Normal mood and affect.  Imaging Studies: No abdominal imaging  Assessment and Plan:   Lauren Solis is a 32 y.o. African-American female with chronic symptomatic hemorrhoids, with worsening in last 1 month seen in consultation for treatment of hemorrhoids. Discussed with patient about outpatient hemorrhoid ligation and she decided to proceed with it. Consent obtained. We will perform hemorrhoid ligation today in clinic.  - high-fiber diet, fiber supplements, education material provided - 0.125% topical nitroglycerin, instructions given - Anusol cream  Iron deficiency anemia: most likely in the setting of pregnancy - Recheck CBC, iron panel   Follow up in 2 weeks   Cephas Darby, MD

## 2018-02-26 NOTE — Patient Instructions (Addendum)
High-Fiber Diet  Fiber, also called dietary fiber, is a type of carbohydrate found in fruits, vegetables, whole grains, and beans. A high-fiber diet can have many health benefits. Your health care provider may recommend a high-fiber diet to help:  · Prevent constipation. Fiber can make your bowel movements more regular.  · Lower your cholesterol.  · Relieve hemorrhoids, uncomplicated diverticulosis, or irritable bowel syndrome.  · Prevent overeating as part of a weight-loss plan.  · Prevent heart disease, type 2 diabetes, and certain cancers.    What is my plan?  The recommended daily intake of fiber includes:  · 38 grams for men under age 50.  · 30 grams for men over age 50.  · 25 grams for women under age 50.  · 21 grams for women over age 50.    You can get the recommended daily intake of dietary fiber by eating a variety of fruits, vegetables, grains, and beans. Your health care provider may also recommend a fiber supplement if it is not possible to get enough fiber through your diet.  What do I need to know about a high-fiber diet?  · Fiber supplements have not been widely studied for their effectiveness, so it is better to get fiber through food sources.  · Always check the fiber content on the nutrition facts label of any prepackaged food. Look for foods that contain at least 5 grams of fiber per serving.  · Ask your dietitian if you have questions about specific foods that are related to your condition, especially if those foods are not listed in the following section.  · Increase your daily fiber consumption gradually. Increasing your intake of dietary fiber too quickly may cause bloating, cramping, or gas.  · Drink plenty of water. Water helps you to digest fiber.  What foods can I eat?  Grains  Whole-grain breads. Multigrain cereal. Oats and oatmeal. Brown rice. Barley. Bulgur wheat. Millet. Bran muffins. Popcorn. Rye wafer crackers.  Vegetables   Sweet potatoes. Spinach. Kale. Artichokes. Cabbage. Broccoli. Green peas. Carrots. Squash.  Fruits  Berries. Pears. Apples. Oranges. Avocados. Prunes and raisins. Dried figs.  Meats and Other Protein Sources  Navy, kidney, pinto, and soy beans. Split peas. Lentils. Nuts and seeds.  Dairy  Fiber-fortified yogurt.  Beverages  Fiber-fortified soy milk. Fiber-fortified orange juice.  Other  Fiber bars.  The items listed above may not be a complete list of recommended foods or beverages. Contact your dietitian for more options.  What foods are not recommended?  Grains  White bread. Pasta made with refined flour. White rice.  Vegetables  Fried potatoes. Canned vegetables. Well-cooked vegetables.  Fruits  Fruit juice. Cooked, strained fruit.  Meats and Other Protein Sources  Fatty cuts of meat. Fried poultry or fried fish.  Dairy  Milk. Yogurt. Cream cheese. Sour cream.  Beverages  Soft drinks.  Other  Cakes and pastries. Butter and oils.  The items listed above may not be a complete list of foods and beverages to avoid. Contact your dietitian for more information.  What are some tips for including high-fiber foods in my diet?  · Eat a wide variety of high-fiber foods.  · Make sure that half of all grains consumed each day are whole grains.  · Replace breads and cereals made from refined flour or white flour with whole-grain breads and cereals.  · Replace white rice with brown rice, bulgur wheat, or millet.  · Start the day with a breakfast that is high in fiber,   such as a cereal that contains at least 5 grams of fiber per serving.  · Use beans in place of meat in soups, salads, or pasta.  · Eat high-fiber snacks, such as berries, raw vegetables, nuts, or popcorn.  This information is not intended to replace advice given to you by your health care provider. Make sure you discuss any questions you have with your health care provider.  Document Released: 12/08/2005 Document Revised: 05/15/2016 Document Reviewed: 05/23/2014   Elsevier Interactive Patient Education © 2018 Elsevier Inc.

## 2018-03-09 ENCOUNTER — Ambulatory Visit: Payer: BLUE CROSS/BLUE SHIELD | Admitting: Physician Assistant

## 2018-03-12 ENCOUNTER — Encounter: Payer: Self-pay | Admitting: Gastroenterology

## 2018-03-12 ENCOUNTER — Ambulatory Visit (INDEPENDENT_AMBULATORY_CARE_PROVIDER_SITE_OTHER): Payer: BLUE CROSS/BLUE SHIELD | Admitting: Gastroenterology

## 2018-03-12 ENCOUNTER — Encounter (INDEPENDENT_AMBULATORY_CARE_PROVIDER_SITE_OTHER): Payer: Self-pay

## 2018-03-12 VITALS — BP 120/82 | HR 71 | Ht 66.0 in | Wt 160.4 lb

## 2018-03-12 DIAGNOSIS — K641 Second degree hemorrhoids: Secondary | ICD-10-CM

## 2018-03-12 NOTE — Progress Notes (Signed)
PROCEDURE NOTE: The patient presents with symptomatic grade 2 hemorrhoids, unresponsive to maximal medical therapy, requesting rubber band ligation of his/her hemorrhoidal disease.  All risks, benefits and alternative forms of therapy were described and informed consent was obtained.  The decision was made to band the RP internal hemorrhoid, and the [place] was used to perform band ligation without complication.  Digital anorectal examination was then performed to assure proper positioning of the band, and to adjust the banded tissue as required.  The patient was discharged home without pain or other issues.  Dietary and behavioral recommendations were given and (if necessary - prescriptions were given), along with follow-up instructions.  The patient will return 2 weeks for follow-up and possible additional banding as required.  LL hemorrhoid was previously ligated  No complications were encountered and the patient tolerated the procedure well.  Cephas Darby, MD [address]  [place]  [place], [place] [address]  Main: [phone]  Fax: [phone] Pager: [phone]

## 2018-03-26 ENCOUNTER — Ambulatory Visit (INDEPENDENT_AMBULATORY_CARE_PROVIDER_SITE_OTHER): Payer: BLUE CROSS/BLUE SHIELD | Admitting: Gastroenterology

## 2018-03-26 ENCOUNTER — Encounter: Payer: Self-pay | Admitting: Gastroenterology

## 2018-03-26 VITALS — BP 119/67 | HR 80 | Temp 98.2°F | Ht 66.0 in | Wt 164.0 lb

## 2018-03-26 DIAGNOSIS — K641 Second degree hemorrhoids: Secondary | ICD-10-CM | POA: Diagnosis not present

## 2018-03-26 NOTE — Progress Notes (Signed)
PROCEDURE NOTE: The patient presents with symptomatic grade 2 hemorrhoids, unresponsive to maximal medical therapy, requesting rubber band ligation of his/her hemorrhoidal disease.  All risks, benefits and alternative forms of therapy were described and informed consent was obtained.   The decision was made to band the RA internal hemorrhoid, and the [place] was used to perform band ligation without complication.  Digital anorectal examination was then performed to assure proper positioning of the band, and to adjust the banded tissue as required.  The patient was discharged home without pain or other issues.  Dietary and behavioral recommendations were given and (if necessary - prescriptions were given), along with follow-up instructions.  The patient will return  as needed for follow-up and possible additional banding as required.  No complications were encountered and the patient tolerated the procedure well.  Cephas Darby, MD [address]  [place]  [place],  [address]  Main: [phone]  Fax: [phone] Pager: [phone]

## 2018-07-09 ENCOUNTER — Ambulatory Visit: Payer: BLUE CROSS/BLUE SHIELD | Admitting: Internal Medicine

## 2018-07-09 ENCOUNTER — Encounter: Payer: Self-pay | Admitting: Internal Medicine

## 2018-07-09 ENCOUNTER — Ambulatory Visit (HOSPITAL_COMMUNITY)
Admission: RE | Admit: 2018-07-09 | Discharge: 2018-07-09 | Disposition: A | Discharge status: Home / Self Care | Payer: BLUE CROSS/BLUE SHIELD | Source: Ambulatory Visit | Attending: Internal Medicine | Admitting: Internal Medicine

## 2018-07-09 VITALS — BP 120/80 | HR 60 | Temp 98.0°F | Wt 157.0 lb

## 2018-07-09 DIAGNOSIS — M79604 Pain in right leg: Secondary | ICD-10-CM | POA: Diagnosis not present

## 2018-07-09 DIAGNOSIS — D2230 Melanocytic nevi of unspecified part of face: Secondary | ICD-10-CM | POA: Diagnosis not present

## 2018-07-09 NOTE — Patient Instructions (Signed)
Mole A mole is a colored (pigmented) growth on the skin. Moles are very common. They are usually harmless, but some moles can become cancerous over time. What are the causes? Moles occur when pigmented skin cells grow together in clusters instead of spreading out in the skin as they normally do. The reason why the skin cells grow together in clusters is not known. What are the signs or symptoms? A mole may be:  Brown or black.  Flat or raised.  Smooth or wrinkled.  How is this diagnosed? A mole is diagnosed with a skin exam. If your health care provider thinks a mole may be cancerous, a piece of the mole will be removed for testing. How is this treated? Treatment is not needed unless a mole is cancerous. If a mole is cancerous, it will be removed. If a mole is causing pain or you do not like the way it looks, you may choose to have it removed. Follow these instructions at home:  Every month, look for new moles and check your existing moles for changes. This is important because a change in a mole can mean that the mole has become cancerous. Look for changes in: ? Size. Look for moles that are more than  in (0.64 cm) wide (in diameter). ? Shape. Look for moles that are not round or oval. ? Borders. Look for moles that are not symmetrical. ? Color. Note that it is normal for moles to get darker during pregnancy or when you take birth control pills.  When you are outdoors, wear sunscreen with SPF 30 (sun protection factor 30) or higher. Reapply the sunscreen every 2-3 hours.  If you have a large number of moles, see a skin doctor (dermatologist) at least one time every year. Contact a health care provider if:  The size, shape, borders, or color of your mole change.  Your mole, or the skin near the mole, becomes painful, sore, red, or swollen.  Your mole: ? Develops more than one color. ? Itches or bleeds. ? Becomes scaly, sheds skin, or oozes fluid. ? Becomes flat or develops  raised areas. ? Becomes hard or soft.  You develop a new mole. This information is not intended to replace advice given to you by your health care provider. Make sure you discuss any questions you have with your health care provider. Document Released: 09/02/2001 Document Revised: 05/21/2016 Document Reviewed: 09/28/2015 Elsevier Interactive Patient Education  2018 Elsevier Inc.  

## 2018-07-09 NOTE — Addendum Note (Signed)
Addended by: Lurlean Nanny on: 07/09/2018 02:42 PM   Modules accepted: Orders

## 2018-07-09 NOTE — Progress Notes (Signed)
*  Preliminary Results* Right lower extremity venous duplex completed. Right lower extremity is negative for deep vein thrombosis. There is no evidence of right Baker's cyst.  07/09/2018 4:04 PM  Maudry Mayhew, BS, RVT, RDCS, RDMS

## 2018-07-09 NOTE — Progress Notes (Signed)
Subjective:    Patient ID: Lauren Solis, female    DOB: [birth year], 32 y.o.   MRN: [id number]  HPI  Pt presents to the clinic today to discuss moles around her eyes. She noticed them about 2 months ago. She feels like the moles are very sore to touch. They used to be flat and now seem to be growing in size. She does not feel like they have changed in color. They bother her when she is working out and she goes to wipe her face. She feels like the moles are getting pulled at. She has no history of skin cancer.  She also reports persistent leg pain. This has been intermittent over the last few months. She describes the pain as sore and achy. The pain does not radiate. It does not really seemed to be affected by movement. She denies redness, swelling, warmth but did have a DVT in this same spot 3 years ago, was on blood thinners x 6 months. She denies any injury to the area. She has been working out, but this is not new, and she has not increased her activity level. She has take Tylenol and Ibuprofen as needed with minimal relief.  Review of Systems      Past Medical History:  Diagnosis Date  . DVT (deep venous thrombosis) ([place])   . Medical history non-contributory   . Varicose veins     Current Outpatient Medications  Medication Sig Dispense Refill  . Multiple Vitamins-Minerals (HAIR VITAMINS PO) Take 2 capsules by mouth daily.    Marland Kitchen NITRO-BID 2 % ointment   0  . PROCTOZONE-HC 2.5 % rectal cream   0   No current facility-administered medications for this visit.     No Known Allergies  Family History  Problem Relation Age of Onset  . Cancer Maternal Aunt        Breast  . Diabetes Maternal Aunt   . Diabetes Maternal Uncle   . Kidney disease Maternal Grandmother     Social History   Socioeconomic History  . Marital status: Married    Spouse name: Not on file  . Number of children: Not on file  . Years of education: Not on file  . Highest education level: Not on file    Occupational History  . Not on file  Social Needs  . Financial resource strain: Not on file  . Food insecurity:    Worry: Not on file    Inability: Not on file  . Transportation needs:    Medical: Not on file    Non-medical: Not on file  Tobacco Use  . Smoking status: Never Smoker  . Smokeless tobacco: Never Used  Substance and Sexual Activity  . Alcohol use: No    Alcohol/week: 0.0 oz  . Drug use: No  . Sexual activity: Yes    Partners: Male    Birth control/protection: None  Lifestyle  . Physical activity:    Days per week: Not on file    Minutes per session: Not on file  . Stress: Not on file  Relationships  . Social connections:    Talks on phone: Not on file    Gets together: Not on file    Attends religious service: Not on file    Active member of club or organization: Not on file    Attends meetings of clubs or organizations: Not on file    Relationship status: Not on file  . Intimate partner violence:    Fear  of current or ex partner: Not on file    Emotionally abused: Not on file    Physically abused: Not on file    Forced sexual activity: Not on file  Other Topics Concern  . Not on file  Social History Narrative  . Not on file     Constitutional: Denies fever, malaise, fatigue, headache or abrupt weight changes.  Respiratory: Denies difficulty breathing, shortness of breath, cough or sputum production.   Cardiovascular: Denies chest pain, chest tightness, palpitations or swelling in the hands or feet.  Musculoskeletal: Pt reports right leg pain. Denies decrease in range of motion, difficulty with gait, muscle pain or joint swelling.  Skin: Pt reports moles around eyes. Denies redness, rashes, or ulcercations.    No other specific complaints in a complete review of systems (except as listed in HPI above).  Objective:   Physical Exam   BP 120/80   Pulse 60   Temp 98 F (36.7 C) (Oral)   Wt 157 lb (71.2 kg)   SpO2 98%   BMI 25.34 kg/m  Wt  Readings from Last 3 Encounters:  07/09/18 157 lb (71.2 kg)  03/26/18 164 lb (74.4 kg)  03/12/18 160 lb 6.4 oz (72.8 kg)    General: Appears her stated age, well developed, well nourished in NAD. Skin: Warm, dry and intact. Small, flat moles noted around eyes. She his a skin tag of right outer lid. Musculoskeletal: Normal flexion and extension of the right knee. No pain with palpation of the right knee. No joint swelling noted. Pain with palpation of the posterior medial hamstring. No difficulty with gait.  Neurological: Alert and oriented.    BMET    Component Value Date/Time   NA 137 09/01/2017 0859   K 4.0 09/01/2017 0859   CL 103 09/01/2017 0859   CO2 26 09/01/2017 0859   GLUCOSE 89 09/01/2017 0859   BUN 13 09/01/2017 0859   CREATININE 0.94 09/01/2017 0859   CALCIUM 9.5 09/01/2017 0859   GFRNONAA >90 02/17/2015 1613   GFRAA >90 02/17/2015 1613    Lipid Panel     Component Value Date/Time   CHOL 168 09/01/2017 0859   TRIG 62.0 09/01/2017 0859   HDL 53.00 09/01/2017 0859   CHOLHDL 3 09/01/2017 0859   VLDL 12.4 09/01/2017 0859   LDLCALC 103 (H) 09/01/2017 0859    CBC    Component Value Date/Time   WBC 4.4 09/01/2017 0859   RBC 5.25 (H) 09/01/2017 0859   HGB 13.8 09/01/2017 0859   HCT 40.2 09/01/2017 0859   PLT 266.0 09/01/2017 0859   MCV 76.6 (L) 09/01/2017 0859   MCH 23.1 (L) 02/17/2015 1613   MCHC 34.4 09/01/2017 0859   RDW 16.6 (H) 09/01/2017 0859   LYMPHSABS 1.5 11/07/2014 1010   MONOABS 0.6 11/07/2014 1010   EOSABS 0.1 11/07/2014 1010   BASOSABS 0.1 11/07/2014 1010    Hgb A1C No results found for: HGBA1C         Assessment & Plan:   Nevus of Face:  Will refer to dermatology for further evaluation and treatment  Right Leg Pain:  Could just be hamstring strain Given prior DVT in same area, will obtain venous doppler of RLE Encouraged ice, elevation, Ibuprofen   Return precautions discussed Webb Silversmith, NP

## 2018-08-10 DIAGNOSIS — L728 Other follicular cysts of the skin and subcutaneous tissue: Secondary | ICD-10-CM | POA: Diagnosis not present

## 2018-08-10 DIAGNOSIS — D23121 Other benign neoplasm of skin of left upper eyelid, including canthus: Secondary | ICD-10-CM | POA: Diagnosis not present

## 2018-09-13 ENCOUNTER — Encounter: Payer: Self-pay | Admitting: Family Medicine

## 2018-09-13 ENCOUNTER — Ambulatory Visit: Payer: BLUE CROSS/BLUE SHIELD | Admitting: Family Medicine

## 2018-09-13 VITALS — BP 100/70 | HR 98 | Temp 97.6°F | Ht 66.0 in | Wt 151.0 lb

## 2018-09-13 DIAGNOSIS — N938 Other specified abnormal uterine and vaginal bleeding: Secondary | ICD-10-CM | POA: Diagnosis not present

## 2018-09-13 DIAGNOSIS — N898 Other specified noninflammatory disorders of vagina: Secondary | ICD-10-CM

## 2018-09-13 NOTE — Progress Notes (Signed)
   Subjective:    Patient ID: Lauren Solis, female    DOB: 1986/06/10, 32 y.o.   MRN: [id number]  HPI This is a 32 yo female who presents today with vaginal discharge x 3 days. Some discomfort and itch. No dysuria, no frequency. Discharge is white/off white. No odor.  No known prior yeast or BV infections.   Past Medical History:  Diagnosis Date  . DVT (deep venous thrombosis) ([place])   . Medical history non-contributory   . Varicose veins    Past Surgical History:  Procedure Laterality Date  . BILATERAL SALPINGECTOMY Bilateral 02/13/2015   Procedure: BILATERAL SALPINGECTOMY;  Surgeon: Alinda Dooms, MD;  Location: [facility];  Service: Gynecology;  Laterality: Bilateral;  . OOPHORECTOMY  2010  . OOPHORECTOMY     not sure which side  . TUBAL LIGATION Bilateral 02/13/2015   Procedure: POST PARTUM TUBAL LIGATION;  Surgeon: Alinda Dooms, MD;  Location: [facility];  Service: Gynecology;  Laterality: Bilateral;   Family History  Problem Relation Age of Onset  . Cancer Maternal Aunt        Breast  . Diabetes Maternal Aunt   . Diabetes Maternal Uncle   . Kidney disease Maternal Grandmother    Social History   Tobacco Use  . Smoking status: Never Smoker  . Smokeless tobacco: Never Used  Substance Use Topics  . Alcohol use: No    Alcohol/week: 0.0 standard drinks  . Drug use: No      Review of Systems Per HPI    Objective:   Physical Exam  Constitutional: She is oriented to person, place, and time. She appears well-developed and well-nourished.  HENT:  Head: Normocephalic and atraumatic.  Cardiovascular: Normal rate.  Pulmonary/Chest: Effort normal.  Genitourinary: Pelvic exam was performed with patient supine. There is no rash, tenderness or lesion on the right labia. There is no rash, tenderness or lesion on the left labia. Vaginal discharge (small amount thick white discharge) found.  Neurological: She is alert and oriented to person, place, and time.  Vitals  reviewed.    BP 100/70 (BP Location: Right Arm, Patient Position: Sitting, Cuff Size: Normal)   Pulse 98   Temp 97.6 F (36.4 C) (Oral)   Ht 5\' 6"  (1.676 m)   Wt 151 lb (68.5 kg)   LMP 08/18/2018   SpO2 98%   BMI 24.37 kg/m  Wt Readings from Last 3 Encounters:  09/13/18 151 lb (68.5 kg)  07/09/18 157 lb (71.2 kg)  03/26/18 164 lb (74.4 kg)        Assessment & Plan:  1. Vaginal discharge - thick, white discharge, suggested she can use OTC treatment for yeast until wet prep results returned - RTC precautions reviewed - WET PREP BY MOLECULAR PROBE   Clarene Reamer, FNP-BC  [facility], [place]  09/15/2018 2:01 PM

## 2018-09-13 NOTE — Patient Instructions (Signed)
I will notify you of your test results Start an over the counter Monistat 7. Use for at least 3 days, can use for full 7 if needed Have a great trip!  Vaginal Yeast infection, Adult Vaginal yeast infection is a condition that causes soreness, swelling, and redness (inflammation) of the vagina. It also causes vaginal discharge. This is a common condition. Some women get this infection frequently. What are the causes? This condition is caused by a change in the normal balance of the yeast (candida) and bacteria that live in the vagina. This change causes an overgrowth of yeast, which causes the inflammation. What increases the risk? This condition is more likely to develop in:  Women who take antibiotic medicines.  Women who have diabetes.  Women who take birth control pills.  Women who are pregnant.  Women who douche often.  Women who have a weak defense (immune) system.  Women who have been taking steroid medicines for a long time.  Women who frequently wear tight clothing.  What are the signs or symptoms? Symptoms of this condition include:  White, thick vaginal discharge.  Swelling, itching, redness, and irritation of the vagina. The lips of the vagina (vulva) may be affected as well.  Pain or a burning feeling while urinating.  Pain during sex.  How is this diagnosed? This condition is diagnosed with a medical history and physical exam. This will include a pelvic exam. Your health care provider will examine a sample of your vaginal discharge under a microscope. Your health care provider may send this sample for testing to confirm the diagnosis. How is this treated? This condition is treated with medicine. Medicines may be over-the-counter or prescription. You may be told to use one or more of the following:  Medicine that is taken orally.  Medicine that is applied as a cream.  Medicine that is inserted directly into the vagina (suppository).  Follow these  instructions at home:  Take or apply over-the-counter and prescription medicines only as told by your health care provider.  Do not have sex until your health care provider has approved. Tell your sex partner that you have a yeast infection. That person should go to his or her health care provider if he or she develops symptoms.  Do not wear tight clothes, such as pantyhose or tight pants.  Avoid using tampons until your health care provider approves.  Eat more yogurt. This may help to keep your yeast infection from returning.  Try taking a sitz bath to help with discomfort. This is a warm water bath that is taken while you are sitting down. The water should only come up to your hips and should cover your buttocks. Do this 3-4 times per day or as told by your health care provider.  Do not douche.  Wear breathable, cotton underwear.  If you have diabetes, keep your blood sugar levels under control. Contact a health care provider if:  You have a fever.  Your symptoms go away and then return.  Your symptoms do not get better with treatment.  Your symptoms get worse.  You have new symptoms.  You develop blisters in or around your vagina.  You have blood coming from your vagina and it is not your menstrual period.  You develop pain in your abdomen. This information is not intended to replace advice given to you by your health care provider. Make sure you discuss any questions you have with your health care provider. Document Released: 09/17/2005 Document Revised:  05/21/2016 Document Reviewed: 06/11/2015 Elsevier Interactive Patient Education  [employer].

## 2018-09-14 LAB — WET PREP BY MOLECULAR PROBE
CANDIDA SPECIES: DETECTED — AB
Gardnerella vaginalis: NOT DETECTED
MICRO NUMBER:: 91139759
SPECIMEN QUALITY:: ADEQUATE
Trichomonas vaginosis: NOT DETECTED

## 2018-09-15 ENCOUNTER — Encounter: Payer: Self-pay | Admitting: Family Medicine

## 2018-10-06 ENCOUNTER — Encounter: Payer: Self-pay | Admitting: Internal Medicine

## 2018-11-22 ENCOUNTER — Ambulatory Visit: Payer: BLUE CROSS/BLUE SHIELD | Admitting: Internal Medicine

## 2019-09-28 ENCOUNTER — Other Ambulatory Visit: Payer: Self-pay

## 2019-09-28 DIAGNOSIS — Z20822 Contact with and (suspected) exposure to covid-19: Secondary | ICD-10-CM

## 2019-09-28 NOTE — Progress Notes (Signed)
[id number] 

## 2019-09-30 LAB — NOVEL CORONAVIRUS, NAA: SARS-CoV-2, NAA: NOT DETECTED

## 2019-11-06 HISTORY — PX: BREAST ENHANCEMENT SURGERY: SHX7

## 2019-11-15 ENCOUNTER — Encounter (HOSPITAL_COMMUNITY): Payer: Self-pay | Admitting: Emergency Medicine

## 2019-11-15 ENCOUNTER — Emergency Department (HOSPITAL_BASED_OUTPATIENT_CLINIC_OR_DEPARTMENT_OTHER): Payer: Medicaid Other

## 2019-11-15 ENCOUNTER — Other Ambulatory Visit: Payer: Self-pay

## 2019-11-15 ENCOUNTER — Emergency Department (HOSPITAL_COMMUNITY)
Admission: EM | Admit: 2019-11-15 | Discharge: 2019-11-15 | Discharge status: Against Medical Advice | Payer: Medicaid Other | Attending: Emergency Medicine | Admitting: Emergency Medicine

## 2019-11-15 DIAGNOSIS — R52 Pain, unspecified: Secondary | ICD-10-CM

## 2019-11-15 DIAGNOSIS — M79605 Pain in left leg: Secondary | ICD-10-CM

## 2019-11-15 DIAGNOSIS — M7989 Other specified soft tissue disorders: Secondary | ICD-10-CM

## 2019-11-15 DIAGNOSIS — Z5321 Procedure and treatment not carried out due to patient leaving prior to being seen by health care provider: Secondary | ICD-10-CM | POA: Insufficient documentation

## 2019-11-15 NOTE — Progress Notes (Signed)
Left lower extremity venous duplex complete. Please see CV Proc tab for preliminary results. Lita Mains- RDMS, RVT 2:31 PM  11/15/2019

## 2019-11-15 NOTE — ED Notes (Signed)
No answer x 3 in waiting room, this RN called vascular who stated they do not have pt in their dept, called pts cell phone x 2 with no answer.

## 2019-11-15 NOTE — ED Triage Notes (Signed)
Pt arrives with c/c of left calf pain for 1 day pt has history of right dvt and recent breast surgery. pcp asked her to come get seen to rule out dvt.

## 2019-12-05 SURGERY — Surgical Case
Anesthesia: *Unknown

## 2019-12-07 ENCOUNTER — Encounter: Payer: Medicaid Other | Admitting: Internal Medicine

## 2020-01-12 ENCOUNTER — Encounter: Payer: Medicaid Other | Admitting: Internal Medicine

## 2020-02-22 ENCOUNTER — Ambulatory Visit (INDEPENDENT_AMBULATORY_CARE_PROVIDER_SITE_OTHER): Payer: Medicaid Other | Admitting: Internal Medicine

## 2020-02-22 ENCOUNTER — Other Ambulatory Visit: Payer: Self-pay

## 2020-02-22 ENCOUNTER — Encounter: Payer: Self-pay | Admitting: Internal Medicine

## 2020-02-22 VITALS — BP 124/78 | HR 78 | Temp 98.1°F | Ht 66.25 in | Wt 164.0 lb

## 2020-02-22 DIAGNOSIS — Z124 Encounter for screening for malignant neoplasm of cervix: Secondary | ICD-10-CM

## 2020-02-22 DIAGNOSIS — Z0000 Encounter for general adult medical examination without abnormal findings: Secondary | ICD-10-CM

## 2020-02-22 DIAGNOSIS — Z113 Encounter for screening for infections with a predominantly sexual mode of transmission: Secondary | ICD-10-CM

## 2020-02-22 NOTE — Patient Instructions (Signed)
Health Maintenance, Female Adopting a healthy lifestyle and getting preventive care are important in promoting health and wellness. Ask your health care provider about:  The right schedule for you to have regular tests and exams.  Things you can do on your own to prevent diseases and keep yourself healthy. What should I know about diet, weight, and exercise? Eat a healthy diet   Eat a diet that includes plenty of vegetables, fruits, low-fat dairy products, and lean protein.  Do not eat a lot of foods that are high in solid fats, added sugars, or sodium. Maintain a healthy weight Body mass index (BMI) is used to identify weight problems. It estimates body fat based on height and weight. Your health care provider can help determine your BMI and help you achieve or maintain a healthy weight. Get regular exercise Get regular exercise. This is one of the most important things you can do for your health. Most adults should:  Exercise for at least 150 minutes each week. The exercise should increase your heart rate and make you sweat (moderate-intensity exercise).  Do strengthening exercises at least twice a week. This is in addition to the moderate-intensity exercise.  Spend less time sitting. Even light physical activity can be beneficial. Watch cholesterol and blood lipids Have your blood tested for lipids and cholesterol at 35 years of age, then have this test every 5 years. Have your cholesterol levels checked more often if:  Your lipid or cholesterol levels are high.  You are older than 35 years of age.  You are at high risk for heart disease. What should I know about cancer screening? Depending on your health history and family history, you may need to have cancer screening at various ages. This may include screening for:  Breast cancer.  Cervical cancer.  Colorectal cancer.  Skin cancer.  Lung cancer. What should I know about heart disease, diabetes, and high blood  pressure? Blood pressure and heart disease  High blood pressure causes heart disease and increases the risk of stroke. This is more likely to develop in people who have high blood pressure readings, are of African descent, or are overweight.  Have your blood pressure checked: ? Every 3-5 years if you are 18-39 years of age. ? Every year if you are 40 years old or older. Diabetes Have regular diabetes screenings. This checks your fasting blood sugar level. Have the screening done:  Once every three years after age 40 if you are at a normal weight and have a low risk for diabetes.  More often and at a younger age if you are overweight or have a high risk for diabetes. What should I know about preventing infection? Hepatitis B If you have a higher risk for hepatitis B, you should be screened for this virus. Talk with your health care provider to find out if you are at risk for hepatitis B infection. Hepatitis C Testing is recommended for:  Everyone born from 1945 through 1965.  Anyone with known risk factors for hepatitis C. Sexually transmitted infections (STIs)  Get screened for STIs, including gonorrhea and chlamydia, if: ? You are sexually active and are younger than 35 years of age. ? You are older than 35 years of age and your health care provider tells you that you are at risk for this type of infection. ? Your sexual activity has changed since you were last screened, and you are at increased risk for chlamydia or gonorrhea. Ask your health care provider if   you are at risk.  Ask your health care provider about whether you are at high risk for HIV. Your health care provider may recommend a prescription medicine to help prevent HIV infection. If you choose to take medicine to prevent HIV, you should first get tested for HIV. You should then be tested every 3 months for as long as you are taking the medicine. Pregnancy  If you are about to stop having your period (premenopausal) and  you may become pregnant, seek counseling before you get pregnant.  Take 400 to 800 micrograms (mcg) of folic acid every day if you become pregnant.  Ask for birth control (contraception) if you want to prevent pregnancy. Osteoporosis and menopause Osteoporosis is a disease in which the bones lose minerals and strength with aging. This can result in bone fractures. If you are 65 years old or older, or if you are at risk for osteoporosis and fractures, ask your health care provider if you should:  Be screened for bone loss.  Take a calcium or vitamin D supplement to lower your risk of fractures.  Be given hormone replacement therapy (HRT) to treat symptoms of menopause. Follow these instructions at home: Lifestyle  Do not use any products that contain nicotine or tobacco, such as cigarettes, e-cigarettes, and chewing tobacco. If you need help quitting, ask your health care provider.  Do not use street drugs.  Do not share needles.  Ask your health care provider for help if you need support or information about quitting drugs. Alcohol use  Do not drink alcohol if: ? Your health care provider tells you not to drink. ? You are pregnant, may be pregnant, or are planning to become pregnant.  If you drink alcohol: ? Limit how much you use to 0-1 drink a day. ? Limit intake if you are breastfeeding.  Be aware of how much alcohol is in your drink. In the [place] one drink equals one 12 oz bottle of beer (355 mL), one 5 oz glass of wine (148 mL), or one 1 oz glass of hard liquor (44 mL). General instructions  Schedule regular health, dental, and eye exams.  Stay current with your vaccines.  Tell your health care provider if: ? You often feel depressed. ? You have ever been abused or do not feel safe at home. Summary  Adopting a healthy lifestyle and getting preventive care are important in promoting health and wellness.  Follow your health care provider's instructions about healthy  diet, exercising, and getting tested or screened for diseases.  Follow your health care provider's instructions on monitoring your cholesterol and blood pressure. This information is not intended to replace advice given to you by your health care provider. Make sure you discuss any questions you have with your health care provider. Document Revised: 12/01/2018 Document Reviewed: 12/01/2018 Elsevier Patient Education  2020 Elsevier Inc.  

## 2020-02-22 NOTE — Progress Notes (Signed)
Subjective:    Patient ID: Lauren Solis, female    DOB: [birth year], 34 y.o.   MRN: [id number]  HPI  Pt presents to the clinic today for her annual exam.  Flu: never Tetanus: > 10 years Pap Smear: 08/2017 Dentist: annually  Diet: She does eat meat. She consumes more veggies than fruits. She does eat some fried foods. She drinks mostly water, soda. Exercise: None  Review of Systems      Past Medical History:  Diagnosis Date  . DVT (deep venous thrombosis) ([place])   . Medical history non-contributory   . Varicose veins     No current outpatient medications on file.   No current facility-administered medications for this visit.    No Known Allergies  Family History  Problem Relation Age of Onset  . Cancer Maternal Aunt        Breast  . Diabetes Maternal Aunt   . Diabetes Maternal Uncle   . Kidney disease Maternal Grandmother     Social History   Socioeconomic History  . Marital status: Married    Spouse name: Not on file  . Number of children: Not on file  . Years of education: Not on file  . Highest education level: Not on file  Occupational History  . Not on file  Tobacco Use  . Smoking status: Never Smoker  . Smokeless tobacco: Never Used  Substance and Sexual Activity  . Alcohol use: No    Alcohol/week: 0.0 standard drinks  . Drug use: No  . Sexual activity: Yes    Partners: Male    Birth control/protection: None  Other Topics Concern  . Not on file  Social History Narrative  . Not on file   Social Determinants of Health   Financial Resource Strain:   . Difficulty of Paying Living Expenses: Not on file  Food Insecurity:   . Worried About Charity fundraiser in the Last Year: Not on file  . Ran Out of Food in the Last Year: Not on file  Transportation Needs:   . Lack of Transportation (Medical): Not on file  . Lack of Transportation (Non-Medical): Not on file  Physical Activity:   . Days of Exercise per Week: Not on file  . Minutes of  Exercise per Session: Not on file  Stress:   . Feeling of Stress : Not on file  Social Connections:   . Frequency of Communication with Friends and Family: Not on file  . Frequency of Social Gatherings with Friends and Family: Not on file  . Attends Religious Services: Not on file  . Active Member of Clubs or Organizations: Not on file  . Attends Archivist Meetings: Not on file  . Marital Status: Not on file  Intimate Partner Violence:   . Fear of Current or Ex-Partner: Not on file  . Emotionally Abused: Not on file  . Physically Abused: Not on file  . Sexually Abused: Not on file     Constitutional: Denies fever, malaise, fatigue, headache or abrupt weight changes.  HEENT: Denies eye pain, eye redness, ear pain, ringing in the ears, wax buildup, runny nose, nasal congestion, bloody nose, or sore throat. Respiratory: Denies difficulty breathing, shortness of breath, cough or sputum production.   Cardiovascular: Denies chest pain, chest tightness, palpitations or swelling in the hands or feet.  Gastrointestinal: Denies abdominal pain, bloating, constipation, diarrhea or blood in the stool.  GU: Denies urgency, frequency, pain with urination, burning sensation, blood in urine,  odor or discharge. Musculoskeletal: Denies decrease in range of motion, difficulty with gait, muscle pain or joint pain and swelling.  Skin: Denies redness, rashes, lesions or ulcercations.  Neurological: Denies dizziness, difficulty with memory, difficulty with speech or problems with balance and coordination.  Psych: Denies anxiety, depression, SI/HI.  No other specific complaints in a complete review of systems (except as listed in HPI above).  Objective:   Physical Exam  BP 124/78   Pulse 78   Temp 98.1 F (36.7 C) (Temporal)   Ht 5' 6.25" (1.683 m)   Wt 164 lb (74.4 kg)   LMP 02/06/2020   SpO2 98%   BMI 26.27 kg/m   Wt Readings from Last 3 Encounters:  09/13/18 151 lb (68.5 kg)    07/09/18 157 lb (71.2 kg)  03/26/18 164 lb (74.4 kg)    General: Appears her stated age, well developed, well nourished in NAD. Skin: Warm, dry and intact. No rashes noted. HEENT: Head: normal shape and size; Eyes: sclera white, no icterus, conjunctiva pink, PERRLA and EOMs intact;  Neck:  Neck supple, trachea midline. No masses, lumps or thyromegaly present.  Cardiovascular: Normal rate and rhythm. S1,S2 noted.  No murmur, rubs or gallops noted. No JVD or BLE edema.  Pulmonary/Chest: Normal effort and positive vesicular breath sounds. No respiratory distress. No wheezes, rales or ronchi noted.  Abdomen: Soft and nontender. Normal bowel sounds. No distention or masses noted. Liver, spleen and kidneys non palpable. Pelvic: Normal female anatomy. Cervix without mass or lesion. No discharge or odor present. No CMT. Adnexa non palpable. Musculoskeletal: Strength 5/5 BUE/BLE. No difficulty with gait.  Neurological: Alert and oriented. Cranial nerves II-XII grossly intact. Coordination normal.  Psychiatric: Mood and affect normal. Behavior is normal. Judgment and thought content normal.    BMET    Component Value Date/Time   NA 137 09/01/2017 0859   K 4.0 09/01/2017 0859   CL 103 09/01/2017 0859   CO2 26 09/01/2017 0859   GLUCOSE 89 09/01/2017 0859   BUN 13 09/01/2017 0859   CREATININE 0.94 09/01/2017 0859   CALCIUM 9.5 09/01/2017 0859   GFRNONAA >90 02/17/2015 1613   GFRAA >90 02/17/2015 1613    Lipid Panel     Component Value Date/Time   CHOL 168 09/01/2017 0859   TRIG 62.0 09/01/2017 0859   HDL 53.00 09/01/2017 0859   CHOLHDL 3 09/01/2017 0859   VLDL 12.4 09/01/2017 0859   LDLCALC 103 (H) 09/01/2017 0859    CBC    Component Value Date/Time   WBC 4.4 09/01/2017 0859   RBC 5.25 (H) 09/01/2017 0859   HGB 13.8 09/01/2017 0859   HCT 40.2 09/01/2017 0859   PLT 266.0 09/01/2017 0859   MCV 76.6 (L) 09/01/2017 0859   MCH 23.1 (L) 02/17/2015 1613   MCHC 34.4 09/01/2017 0859    RDW 16.6 (H) 09/01/2017 0859   LYMPHSABS 1.5 11/07/2014 1010   MONOABS 0.6 11/07/2014 1010   EOSABS 0.1 11/07/2014 1010   BASOSABS 0.1 11/07/2014 1010    Hgb A1C No results found for: HGBA1C         Assessment & Plan:   Preventative Health Maintenance:  She declines flu shot She declines tetanus booster today Pap smear today, STD screening today Encouraged her to consume a balanced diet and exercise regimen Advised her to see a dentist annually Will check CBC, CMET, Lipid, HSV, HIV and RPR today  Return precautions discussed Webb Silversmith, NP This visit occurred during the SARS-CoV-2 public  health emergency.  Safety protocols were in place, including screening questions prior to the visit, additional usage of staff PPE, and extensive cleaning of exam room while observing appropriate contact time as indicated for disinfecting solutions.

## 2020-02-23 ENCOUNTER — Other Ambulatory Visit (HOSPITAL_COMMUNITY)
Admission: RE | Admit: 2020-02-23 | Discharge: 2020-02-23 | Disposition: A | Discharge status: Home / Self Care | Payer: Medicaid Other | Source: Ambulatory Visit | Attending: Internal Medicine | Admitting: Internal Medicine

## 2020-02-23 DIAGNOSIS — Z124 Encounter for screening for malignant neoplasm of cervix: Secondary | ICD-10-CM | POA: Diagnosis not present

## 2020-02-23 DIAGNOSIS — Z113 Encounter for screening for infections with a predominantly sexual mode of transmission: Secondary | ICD-10-CM | POA: Insufficient documentation

## 2020-02-23 LAB — LIPID PANEL
Cholesterol: 177 mg/dL (ref 0–200)
HDL: 61.2 mg/dL (ref >39.00)
LDL Cholesterol: 106 mg/dL — ABNORMAL HIGH (ref 0–99)
NonHDL: 115.92
Total CHOL/HDL Ratio: 3
Triglycerides: 52 mg/dL (ref 0.0–149.0)
VLDL: 10.4 mg/dL (ref 0.0–40.0)

## 2020-02-23 LAB — COMPREHENSIVE METABOLIC PANEL
ALT: 8 U/L (ref 0–35)
AST: 12 U/L (ref 0–37)
Albumin: 4.1 g/dL (ref 3.5–5.2)
Alkaline Phosphatase: 54 U/L (ref 39–117)
BUN: 11 mg/dL (ref 6–23)
CO2: 29 mEq/L (ref 19–32)
Calcium: 9.6 mg/dL (ref 8.4–10.5)
Chloride: 105 mEq/L (ref 96–112)
Creatinine, Ser: 0.97 mg/dL (ref 0.40–1.20)
GFR: 79.38 mL/min (ref >60.00)
Glucose, Bld: 101 mg/dL — ABNORMAL HIGH (ref 70–99)
Potassium: 4.9 mEq/L (ref 3.5–5.1)
Sodium: 138 mEq/L (ref 135–145)
Total Bilirubin: 1 mg/dL (ref 0.2–1.2)
Total Protein: 7.6 g/dL (ref 6.0–8.3)

## 2020-02-23 LAB — CBC
HCT: 37.5 % (ref 36.0–46.0)
Hemoglobin: 12.8 g/dL (ref 12.0–15.0)
MCHC: 34.1 g/dL (ref 30.0–36.0)
MCV: 71.4 fl — ABNORMAL LOW (ref 78.0–100.0)
Platelets: 262 10*3/uL (ref 150.0–400.0)
RBC: 5.25 Mil/uL — ABNORMAL HIGH (ref 3.87–5.11)
RDW: 17.3 % — ABNORMAL HIGH (ref 11.5–15.5)
WBC: 5.7 10*3/uL (ref 4.0–10.5)

## 2020-02-23 NOTE — Addendum Note (Signed)
Addended by: Lurlean Nanny on: 02/23/2020 08:38 AM   Modules accepted: Orders

## 2020-02-25 LAB — HSV(HERPES SIMPLEX VRS) I + II AB-IGG
HAV 1 IGG,TYPE SPECIFIC AB: <0.9 index
HSV 2 IGG,TYPE SPECIFIC AB: <0.9 index

## 2020-02-25 LAB — HSV 1/2 AB (IGM), IFA W/RFLX TITER
HSV 1 IgM Screen: NEGATIVE
HSV 2 IgM Screen: NEGATIVE

## 2020-02-25 LAB — RPR: RPR Ser Ql: NONREACTIVE

## 2020-02-25 LAB — HIV ANTIBODY (ROUTINE TESTING W REFLEX): HIV 1&2 Ab, 4th Generation: NONREACTIVE

## 2020-02-27 LAB — CYTOLOGY - PAP
Chlamydia: NEGATIVE
Comment: NEGATIVE
Comment: NEGATIVE
Comment: NEGATIVE
Comment: NORMAL
Diagnosis: NEGATIVE
High risk HPV: NEGATIVE
Neisseria Gonorrhea: NEGATIVE
Trichomonas: NEGATIVE

## 2020-03-08 DIAGNOSIS — H5201 Hypermetropia, right eye: Secondary | ICD-10-CM | POA: Diagnosis not present

## 2021-03-15 ENCOUNTER — Other Ambulatory Visit: Payer: Self-pay

## 2021-03-15 ENCOUNTER — Emergency Department (HOSPITAL_COMMUNITY)
Admission: EM | Admit: 2021-03-15 | Discharge: 2021-03-15 | Disposition: A | Discharge status: Home / Self Care | Payer: BC Managed Care – PPO | Attending: Emergency Medicine | Admitting: Emergency Medicine

## 2021-03-15 ENCOUNTER — Telehealth: Payer: Self-pay

## 2021-03-15 ENCOUNTER — Encounter (HOSPITAL_COMMUNITY): Payer: Self-pay

## 2021-03-15 DIAGNOSIS — M542 Cervicalgia: Secondary | ICD-10-CM | POA: Diagnosis not present

## 2021-03-15 DIAGNOSIS — H9201 Otalgia, right ear: Secondary | ICD-10-CM | POA: Insufficient documentation

## 2021-03-15 DIAGNOSIS — B0239 Other herpes zoster eye disease: Secondary | ICD-10-CM | POA: Insufficient documentation

## 2021-03-15 DIAGNOSIS — R519 Headache, unspecified: Secondary | ICD-10-CM | POA: Insufficient documentation

## 2021-03-15 DIAGNOSIS — B0230 Zoster ocular disease, unspecified: Secondary | ICD-10-CM

## 2021-03-15 DIAGNOSIS — R21 Rash and other nonspecific skin eruption: Secondary | ICD-10-CM | POA: Diagnosis present

## 2021-03-15 MED ORDER — ACYCLOVIR 400 MG PO TABS: 800.0000 mg | ORAL_TABLET | Freq: Every day | ORAL | 0 refills | Status: AC

## 2021-03-15 MED ORDER — HYDROCODONE-ACETAMINOPHEN 5-325 MG PO TABS
1.0000 | ORAL_TABLET | Freq: Once | ORAL | Status: AC
  Administered 2021-03-15: 1 via ORAL
  Filled 2021-03-15: qty 1

## 2021-03-15 MED ORDER — OXYCODONE-ACETAMINOPHEN 5-325 MG PO TABS: 1.0000 | ORAL_TABLET | Freq: Three times a day (TID) | ORAL | 0 refills | Status: DC | PRN

## 2021-03-15 NOTE — Discharge Instructions (Addendum)
Like we discussed, I am prescribing you an antiviral medication.  This has been shown to help with reducing the length of the shingles rash as well as complications afterwards such as pain in the region.  This medication is called acyclovir and you need to take it 5 times a day for the next 7 days.  Do not stop taking this medication early.  I have also prescribed you a strong narcotic called percocet. Please only take this as prescribed. I would recommend taking ibuprofen or tylenol for your pain throughout the day and only take this medication for breakthrough pain that you cannot control. This medication also has tylenol in it, so please be sure you are not taking more than 3000 mg of tylenol per day. Do not drive or operate heavy machinery after taking this medication. Do not mix it with alcohol.   I have called a local ophthalmologist, Dr. Valetta Close.  He is with [facility].  His contact information as well as address is below.  Please go there as soon as possible to be reevaluated for your right eye as well.  If you develop worsening symptoms, please return to the emergency department.  It was a pleasure to meet you.

## 2021-03-15 NOTE — Telephone Encounter (Signed)
Noted, agree with ED eval.

## 2021-03-15 NOTE — ED Triage Notes (Signed)
Patient erports a headache x 2 days. Patient also c/o right eyes swelling, raised areas to her forehead, behind the right ear and on the back of her neck. Patient also c/o jaw pain when she opens her mouth.

## 2021-03-15 NOTE — ED Notes (Addendum)
Dermatome noted with crusty red papules located on middle of frontal forehead following up to scalp. Right upper eyelid swelling noted.   Patient significant other at bedside.

## 2021-03-15 NOTE — Telephone Encounter (Signed)
Lauren Solis pts mom (DPR signed) said that pt has had h/a for 2 days but h/a has worsened today to severe.pt has swollen forehead with knot larger than 50 cent piece in mid forehead. Pt has knot on rt side of neck. Rt eyelid is swollen but no vision changes; rt eye and ear hurts. No swelling in mouth, neck or throat and no difficulty breathing. Pt has no known injury and has not been bitten by insect. Pt went to [facility] in [place] on 03/14/21; Lauren Solis said nothing done. Pt will go to [facility] ED now. I spoke with Lattie Haw at [facility] ED and no one in waiting room now and I spoke with Marzetta Board charge nurse to let her be aware pt is on her way and pts symptoms. Lauren Solis said they are leaving now for [facility] ED and very appreciative. 15' appt on 03/18/21 with Avie Echevaria NP cancelled. Advised pts mom ED should tell them who to FU up with and when.sending note to Avie Echevaria NP and [facility] CMA.

## 2021-03-15 NOTE — ED Provider Notes (Signed)
[facility] DEPT Provider Note   CSN: [id number] Arrival date & time: 03/15/21  9892     History Chief Complaint  Patient presents with  . Headache  . Otalgia  . Facial Swelling    Lauren Solis is a 35 y.o. female.  HPI Patient is a 35 year old female with a medical history as noted below.  She presents the emergency department today due to a rash.  She states that started about 3 days ago.  Initially started on her forehead and right eyelid and now notes pain and swelling just posterior to the right ear as well as down the right lateral neck.  Pain worsens with palpation as well as movement of the head and neck.  No fevers, chills, nausea, vomiting, chest pain, shortness of breath, URI symptoms.  Patient has no other complaints.  Denies any blurry vision or visual changes.    Past Medical History:  Diagnosis Date  . DVT (deep venous thrombosis) ([place])   . Medical history non-contributory   . Varicose veins     There are no problems to display for this patient.   Past Surgical History:  Procedure Laterality Date  . BILATERAL SALPINGECTOMY Bilateral 02/13/2015   Procedure: BILATERAL SALPINGECTOMY;  Surgeon: Alinda Dooms, MD;  Location: [facility];  Service: Gynecology;  Laterality: Bilateral;  . BREAST ENHANCEMENT SURGERY  11/06/2019  . BREAST SURGERY    . OOPHORECTOMY  2010  . OOPHORECTOMY     not sure which side  . TUBAL LIGATION Bilateral 02/13/2015   Procedure: POST PARTUM TUBAL LIGATION;  Surgeon: Alinda Dooms, MD;  Location: [facility];  Service: Gynecology;  Laterality: Bilateral;     OB History    Gravida  2   Para  2   Term  2   Preterm  0   AB  0   Living  1     SAB  0   IAB  0   Ectopic  0   Multiple  0   Live Births  1           Family History  Problem Relation Age of Onset  . Cancer Maternal Aunt        Breast  . Diabetes Maternal Aunt   . Diabetes Maternal Uncle   . Kidney disease Maternal  Grandmother     Social History   Tobacco Use  . Smoking status: Never Smoker  . Smokeless tobacco: Never Used  Vaping Use  . Vaping Use: Never used  Substance Use Topics  . Alcohol use: No    Alcohol/week: 0.0 standard drinks  . Drug use: No    Home Medications Prior to Admission medications   Medication Sig Start Date End Date Taking? Authorizing Provider  acyclovir (ZOVIRAX) 400 MG tablet Take 2 tablets (800 mg total) by mouth 5 (five) times daily for 7 days. 03/15/21 03/22/21 Yes Rayna Sexton, PA-C  oxyCODONE-acetaminophen (PERCOCET/ROXICET) 5-325 MG tablet Take 1 tablet by mouth every 8 (eight) hours as needed for severe pain. 03/15/21  Yes Rayna Sexton, PA-C    Allergies    Patient has no known allergies.  Review of Systems   Review of Systems  All other systems reviewed and are negative. Ten systems reviewed and are negative for acute change, except as noted in the HPI.   Physical Exam Updated Vital Signs BP (!) 128/93 (BP Location: Right Arm)   Pulse 82   Temp 98.3 F (36.8 C) (Oral)   Resp  16   Ht 5' 6.5" (1.689 m)   Wt 77.1 kg   LMP 02/22/2021 (Approximate)   SpO2 100%   BMI 27.03 kg/m   Physical Exam Vitals and nursing note reviewed.  Constitutional:      General: She is not in acute distress.    Appearance: Normal appearance. She is well-developed and normal weight. She is not ill-appearing, toxic-appearing or diaphoretic.  HENT:     Head: Normocephalic and atraumatic.     Comments: Erythematous vesicular rash noted to the right side of the forehead as well as right eyebrow.  Additional mild erythema and swelling of the right superior eyelid.  Mild tenderness noted just posterior to the right ear.  No pain with manipulation of the right auricle.    Right Ear: External ear normal.     Left Ear: External ear normal.     Ears:     Comments: Bilateral ears, EACs, and TMs all appear normal.  No rash noted in these regions.    Nose: Nose normal.      Mouth/Throat:     Mouth: Mucous membranes are moist.     Pharynx: Oropharynx is clear. No oropharyngeal exudate or posterior oropharyngeal erythema.  Eyes:     General: No scleral icterus.    Extraocular Movements: Extraocular movements intact.     Right eye: Normal extraocular motion and no nystagmus.     Left eye: Normal extraocular motion and no nystagmus.     Pupils: Pupils are equal, round, and reactive to light. Pupils are equal.     Right eye: Pupil is round and reactive.     Left eye: Pupil is round and reactive.     Comments: Pupils are equal, round, and reactive to light.  Extraocular movements are intact.  Sclera noninjected.  Visual fields intact.  Cardiovascular:     Rate and Rhythm: Normal rate and regular rhythm.     Pulses: Normal pulses.     Heart sounds: Normal heart sounds. No murmur heard. No friction rub. No gallop.   Pulmonary:     Effort: Pulmonary effort is normal. No respiratory distress.     Breath sounds: Normal breath sounds. No stridor. No wheezing, rhonchi or rales.  Abdominal:     General: Abdomen is flat.     Palpations: Abdomen is soft.     Tenderness: There is no abdominal tenderness.  Musculoskeletal:        General: Normal range of motion.     Cervical back: Normal range of motion and neck supple. No tenderness.  Skin:    General: Skin is warm and dry.  Neurological:     General: No focal deficit present.     Mental Status: She is alert and oriented to person, place, and time.     GCS: GCS eye subscore is 4. GCS verbal subscore is 5. GCS motor subscore is 6.     Comments: Patient is oriented to person, place, and time. Patient phonates in clear, complete, and coherent sentences. No gross deficits.   Psychiatric:        Mood and Affect: Mood normal.        Behavior: Behavior normal.    ED Results / Procedures / Treatments   Labs (all labs ordered are listed, but only abnormal results are displayed) Labs Reviewed - No data to  display  EKG None  Radiology No results found.  Procedures Procedures   Medications Ordered in ED Medications  HYDROcodone-acetaminophen (NORCO/VICODIN) 5-325 MG  per tablet 1 tablet (1 tablet Oral Given 03/15/21 1044)    ED Course  I have reviewed the triage vital signs and the nursing notes.  Pertinent labs & imaging results that were available during my care of the patient were reviewed by me and considered in my medical decision making (see chart for details).    MDM Rules/Calculators/A&P                          Patient presents the emergency department with a developing rash along the right side of the face as well as a headache for the past 3 days.  Neurological exam is reassuring but rash is concerning for possible developing herpes zoster.  Patient discussed with and evaluated by my attending physician Dr. Milton Ferguson who was in agreement.  We will start patient on a course of acyclovir.  We will also prescribe a course of Percocet for breakthrough pain.  We discussed dosing as well as safety.  Patient reports history of tubal ligation understands this medication is not safe to take during pregnancy.  She denies any possibility of being pregnant.   Patient discussed with on-call ophthalmologist to Dr. Valetta Close.  I spoke to staff in his office and they said that patient can be seen immediately in their office.  Discussed this with the patient and she is amenable.  Will discharge patient with courses of acyclovir as well as Percocet and will have her follow-up with ophthalmology later today.  We discussed return precautions.  Her questions were answered and she was amicable to time of discharge.  Final Clinical Impression(s) / ED Diagnoses Final diagnoses:  Herpes zoster with ophthalmic complication, unspecified herpes zoster eye disease   Rx / DC Orders ED Discharge Orders         Ordered    oxyCODONE-acetaminophen (PERCOCET/ROXICET) 5-325 MG tablet  Every 8 hours PRN         03/15/21 1043    acyclovir (ZOVIRAX) 400 MG tablet  5 times daily        03/15/21 1043           Rayna Sexton, PA-C 03/15/21 1047    Milton Ferguson, MD 03/16/21 [phone]

## 2021-03-18 ENCOUNTER — Ambulatory Visit: Payer: Medicaid Other | Admitting: Internal Medicine

## 2022-02-26 ENCOUNTER — Encounter: Payer: Self-pay | Admitting: Nurse Practitioner

## 2022-02-26 ENCOUNTER — Other Ambulatory Visit: Payer: Self-pay

## 2022-02-26 ENCOUNTER — Ambulatory Visit (INDEPENDENT_AMBULATORY_CARE_PROVIDER_SITE_OTHER): Payer: 59 | Admitting: Nurse Practitioner

## 2022-02-26 VITALS — BP 118/86 | HR 83 | Temp 97.9°F | Resp 10 | Ht 66.5 in | Wt 168.4 lb

## 2022-02-26 DIAGNOSIS — Z1231 Encounter for screening mammogram for malignant neoplasm of breast: Secondary | ICD-10-CM | POA: Diagnosis not present

## 2022-02-26 DIAGNOSIS — Z713 Dietary counseling and surveillance: Secondary | ICD-10-CM | POA: Insufficient documentation

## 2022-02-26 DIAGNOSIS — R61 Generalized hyperhidrosis: Secondary | ICD-10-CM | POA: Insufficient documentation

## 2022-02-26 DIAGNOSIS — Z23 Encounter for immunization: Secondary | ICD-10-CM

## 2022-02-26 DIAGNOSIS — Z0000 Encounter for general adult medical examination without abnormal findings: Secondary | ICD-10-CM | POA: Insufficient documentation

## 2022-02-26 DIAGNOSIS — R21 Rash and other nonspecific skin eruption: Secondary | ICD-10-CM | POA: Diagnosis not present

## 2022-02-26 LAB — TSH: TSH: 0.76 u[IU]/mL (ref 0.35–5.50)

## 2022-02-26 LAB — COMPREHENSIVE METABOLIC PANEL
ALT: 9 U/L (ref 0–35)
AST: 14 U/L (ref 0–37)
Albumin: 4.2 g/dL (ref 3.5–5.2)
Alkaline Phosphatase: 60 U/L (ref 39–117)
BUN: 10 mg/dL (ref 6–23)
CO2: 27 mEq/L (ref 19–32)
Calcium: 9.2 mg/dL (ref 8.4–10.5)
Chloride: 103 mEq/L (ref 96–112)
Creatinine, Ser: 0.86 mg/dL (ref 0.40–1.20)
GFR: 86.95 mL/min (ref >60.00)
Glucose, Bld: 83 mg/dL (ref 70–99)
Potassium: 5.1 mEq/L (ref 3.5–5.1)
Sodium: 136 mEq/L (ref 135–145)
Total Bilirubin: 1 mg/dL (ref 0.2–1.2)
Total Protein: 7.1 g/dL (ref 6.0–8.3)

## 2022-02-26 LAB — CBC
HCT: 37.2 % (ref 36.0–46.0)
Hemoglobin: 12.7 g/dL (ref 12.0–15.0)
MCHC: 34.2 g/dL (ref 30.0–36.0)
MCV: 74.4 fl — ABNORMAL LOW (ref 78.0–100.0)
Platelets: 258 10*3/uL (ref 150.0–400.0)
RBC: 4.99 Mil/uL (ref 3.87–5.11)
RDW: 16.6 % — ABNORMAL HIGH (ref 11.5–15.5)
WBC: 3.9 10*3/uL — ABNORMAL LOW (ref 4.0–10.5)

## 2022-02-26 LAB — FOLLICLE STIMULATING HORMONE: FSH: 3.5 m[IU]/mL

## 2022-02-26 MED ORDER — METHYLPREDNISOLONE ACETATE 80 MG/ML IJ SUSP
80.0000 mg | Freq: Once | INTRAMUSCULAR | Status: AC
Start: 2022-02-26 — End: 2022-02-26
  Administered 2022-02-26: 80 mg via INTRAMUSCULAR

## 2022-02-26 NOTE — Assessment & Plan Note (Signed)
Tdap in office. ?

## 2022-02-26 NOTE — Assessment & Plan Note (Signed)
Patient has maternal aunt that had breast cancer.  We will get her for baseline mammogram order placed. ?

## 2022-02-26 NOTE — Patient Instructions (Addendum)
Nice to see you today ?I will be in touch with the labs once they results ?I want to see you in 6 months for your physical, schedule sooner if you need me of or the rash does not go away. Try over the counter zyrtec and continue doing the tepid bathes and the Cerv'a lotion  ?

## 2022-02-26 NOTE — Assessment & Plan Note (Signed)
Trouble losing some weight.  Patient technically in the overweight category according to patient BMI.  Continue working on diet modifications along with lifestyle modifications.  Did encourage a food tracking app.  Also encouraged to reevaluate her portion size.  We will check basic labs in office ?

## 2022-02-26 NOTE — Assessment & Plan Note (Signed)
Large rash covering truncal portions and arms and body.  Will administer Depo-Medrol 80 mg IM x1 dose in office.  Patient will follow-up if no improvement.  Did recommend taking over-the-counter antihistamine such as Zyrtec to help with itching continue using tepid baths and CeraVe a lotion ?

## 2022-02-26 NOTE — Assessment & Plan Note (Signed)
Patient having night sweats approximately 2 times a week that wakes her up encouraged her to change clothing.  States no concern for HIV or TB.  States she is wonder if she is going through the change of menopause.  We will check basic labs in order hormone levels pending results ?

## 2022-02-26 NOTE — Progress Notes (Signed)
Established Patient Office Visit  Subjective:  Patient ID: Lauren Solis, female    DOB: 1986-06-01  Age: 36 y.o. MRN: [id number]  CC:  Chief Complaint  Patient presents with   Transfer of Care   Rash    Sx present for 1 month-Itching present, arms, abdomen, upper chest.   discuss loosing weight    HPI Lauren Solis presents for TOC  Rash: States that it started seasonally with hot weather. Approx the past 3 months it got bad and worse over the last month. States bilateral chest and belly. She has tried cortisone itch cream and did help with the itching.  Been doing tepid bath and Cerave lotion. No OTC antihistamine, has changed soaps. States son has eczema but no other person has rash in house  Weight loss: states that she really wants to lose weight. She has tried upping her exercise. States she will do 2 times daily. 30-1 hour twice daily. Cardio type stuff. Evening is more walking. Mornings will jump rope and pilates and calesthinecs. Has done meal prep. States that she skips breakfast. Will have dinner and snack before lunch. Snack on fruit or FF. Knocked out sodas. She will do water with flavoring.  Night sweats: 2 nights a week at least. Enough to wake her up LMP: approx 2 weeks ago and regular   Immunizations: -Tetanus:2023 -Influenza: refused -Covid-19: pfizer x2 -Shingles: NA -Pneumonia: NA  -HPV:  Diet: Fair diet. See above Exercise: See above  Eye exam: Completes annually  Dental exam: Completes semi-annually   Pap Smear: Completed in 2021. History of 1 abnormal with presume is coloposcopy. Mid 20s Mammogram: needs baseline Dexa: NA Colonoscopy: NA  PSA: NA  Lung Cancer Screening: NA     Past Medical History:  Diagnosis Date   DVT (deep venous thrombosis) ([place])    Medical history non-contributory    Varicose veins     Past Surgical History:  Procedure Laterality Date   BILATERAL SALPINGECTOMY Bilateral 02/13/2015   Procedure: BILATERAL  SALPINGECTOMY;  Surgeon: Alinda Dooms, MD;  Location: [facility];  Service: Gynecology;  Laterality: Bilateral;   BREAST ENHANCEMENT SURGERY  11/06/2019   BREAST SURGERY     OOPHORECTOMY  2010   OOPHORECTOMY     not sure which side   TUBAL LIGATION Bilateral 02/13/2015   Procedure: POST PARTUM TUBAL LIGATION;  Surgeon: Alinda Dooms, MD;  Location: [facility];  Service: Gynecology;  Laterality: Bilateral;    Family History  Problem Relation Age of Onset   Hypertension Mother    Cancer Maternal Aunt        Breast   Diabetes Maternal Aunt    Diabetes Maternal Uncle    Kidney disease Maternal Grandmother    Diabetes Paternal Grandfather     Social History   Socioeconomic History   Marital status: Married    Spouse name: Not on file   Number of children: 2   Years of education: Not on file   Highest education level: Not on file  Occupational History   Not on file  Tobacco Use   Smoking status: Never   Smokeless tobacco: Never  Vaping Use   Vaping Use: Never used  Substance and Sexual Activity   Alcohol use: No    Alcohol/week: 0.0 standard drinks   Drug use: No   Sexual activity: Yes    Partners: Male    Birth control/protection: None  Other Topics Concern   Not on file  Social History Narrative  2 bio and 3 step children      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 10 tablet 0   No facility-administered medications prior to visit.    No Known Allergies  ROS Review of Systems   Constitutional:  Positive for fatigue. Negative for chills and fever.  Respiratory:  Negative for cough and shortness of breath.   Cardiovascular:  Negative for chest pain and leg swelling.  Gastrointestinal:  Negative for diarrhea, nausea and vomiting.       BM daily   Genitourinary:  Negative for difficulty urinating, vaginal bleeding, vaginal discharge and vaginal pain.  Neurological:  Negative for dizziness, light-headedness, numbness and headaches.  Psychiatric/Behavioral:  Negative for hallucinations and suicidal ideas.      Objective:    Physical Exam Vitals and nursing note reviewed.  Constitutional:      Appearance: Normal appearance.  HENT:     Right Ear: Tympanic membrane, ear canal and external ear normal.     Left Ear: Tympanic membrane, ear canal and external ear normal.     Mouth/Throat:     Mouth: Mucous membranes are moist.     Pharynx: Oropharynx is clear.     Comments: Grade 2+ tonsils  Neck:     Thyroid: No thyroid mass, thyromegaly or thyroid tenderness.  Cardiovascular:     Rate and Rhythm: Normal rate and regular rhythm.     Pulses: Normal pulses.     Heart sounds: Normal heart sounds.  Pulmonary:     Effort: Pulmonary effort is normal.     Breath sounds: Normal breath sounds.  Abdominal:     General: Bowel sounds are normal. There is no distension.     Palpations: There is no mass.     Tenderness: There is no abdominal tenderness.     Hernia: No hernia is present.  Lymphadenopathy:     Cervical: No cervical adenopathy.  Skin:    General: Skin is warm.     Findings: Rash present.     Comments: Darker patch to the left AC area. Scattered papular rash to bilateral upper arms and bilateral outer chest and abdomen  Neurological:     Mental Status: She is alert.  Psychiatric:        Mood and Affect: Mood normal.        Behavior: Behavior normal.        Thought Content: Thought content normal.        Judgment: Judgment normal.    BP 118/86     Pulse 83    Temp 97.9 F (36.6 C)    Resp 10    Ht 5' 6.5" (1.689 m)    Wt 168 lb 7 oz (76.4 kg)    LMP 02/12/2022    SpO2 98%    Breastfeeding No    BMI 26.78 kg/m  Wt Readings from Last 3 Encounters:  02/26/22 168 lb 7 oz (76.4 kg)  03/15/21 170 lb (77.1 kg)  02/22/20 164  lb (74.4 kg)     Health Maintenance Due  Topic Date Due   Hepatitis C Screening  Never done   COVID-19 Vaccine (3 - Booster for Pfizer series) 06/28/2020    There are no preventive care reminders to display for this patient.  Lab Results  Component Value Date   TSH 1.44 09/01/2017   Lab Results  Component Value Date   WBC 5.7 02/22/2020   HGB 12.8 02/22/2020   HCT 37.5 02/22/2020   MCV 71.4 (L) 02/22/2020   PLT 262.0 02/22/2020   Lab Results  Component Value Date   NA 138 02/22/2020   K 4.9 02/22/2020   CO2 29 02/22/2020   GLUCOSE 101 (H) 02/22/2020   BUN 11 02/22/2020   CREATININE 0.97 02/22/2020   BILITOT 1.0 02/22/2020   ALKPHOS 54 02/22/2020   AST 12 02/22/2020   ALT 8 02/22/2020   PROT 7.6 02/22/2020   ALBUMIN 4.1 02/22/2020   CALCIUM 9.6 02/22/2020   ANIONGAP 2 (L) 02/17/2015   GFR 79.38 02/22/2020   Lab Results  Component Value Date   CHOL 177 02/22/2020   Lab Results  Component Value Date   HDL 61.20 02/22/2020   Lab Results  Component Value Date   LDLCALC 106 (H) 02/22/2020   Lab Results  Component Value Date   TRIG 52.0 02/22/2020   Lab Results  Component Value Date   CHOLHDL 3 02/22/2020   No results found for: HGBA1C    Assessment & Plan:   Problem List Items Addressed This Visit       Musculoskeletal and Integument   Rash    Large rash covering truncal portions and arms and body.  Will administer Depo-Medrol 80 mg IM x1 dose in office.  Patient will follow-up if no improvement.  Did recommend taking over-the-counter antihistamine such as Zyrtec to help with itching continue using tepid baths and CeraVe a lotion        Other   Need for  diphtheria-tetanus-pertussis (Tdap) vaccine - Primary    Tdap in office.      Relevant Orders   Tdap vaccine greater than or equal to 7yo IM (Completed)   Night sweats    Patient having night sweats approximately 2 times a week that wakes her up encouraged her to change clothing.  States no concern for HIV or TB.  States she is wonder if she is going through the change of menopause.  We will check basic labs in order hormone levels pending results      Relevant Orders   CBC   Comprehensive metabolic panel   TSH   Follicle Stimulating Hormone   Estrogens, Total   Encounter for weight loss counseling    Trouble losing some weight.  Patient technically in the overweight category according to patient BMI.  Continue working on diet modifications along with lifestyle modifications.  Did encourage a food tracking app.  Also encouraged to reevaluate her portion size.  We will check basic labs in office      Relevant Orders   CBC   Comprehensive metabolic panel   TSH   Encounter for screening mammogram for malignant neoplasm of breast    Patient has maternal aunt that had breast cancer.  We will get her for baseline mammogram order placed.      Relevant Orders   MM Digital Screening    Meds ordered this encounter  Medications   methylPREDNISolone acetate (DEPO-MEDROL) injection 80 mg    Follow-up: Return in  about 6 months (around 08/29/2022) for cpe.  This visit occurred during the SARS-CoV-2 public health emergency.  Safety protocols were in place, including screening questions prior to the visit, additional usage of staff PPE, and extensive cleaning of exam room while observing appropriate contact time as indicated for disinfecting solutions.     Romilda Garret, NP

## 2022-02-27 ENCOUNTER — Other Ambulatory Visit (INDEPENDENT_AMBULATORY_CARE_PROVIDER_SITE_OTHER): Payer: 59

## 2022-02-27 DIAGNOSIS — R799 Abnormal finding of blood chemistry, unspecified: Secondary | ICD-10-CM | POA: Diagnosis not present

## 2022-02-27 LAB — IBC + FERRITIN
Ferritin: 5.7 ng/mL — ABNORMAL LOW (ref 10.0–291.0)
Iron: 52 ug/dL (ref 42–145)
Saturation Ratios: 10.9 % — ABNORMAL LOW (ref 20.0–50.0)
TIBC: 476 ug/dL — ABNORMAL HIGH (ref 250.0–450.0)
Transferrin: 340 mg/dL (ref 212.0–360.0)

## 2022-02-28 ENCOUNTER — Other Ambulatory Visit: Payer: Self-pay | Admitting: Nurse Practitioner

## 2022-02-28 DIAGNOSIS — D508 Other iron deficiency anemias: Secondary | ICD-10-CM

## 2022-02-28 MED ORDER — IRON (FERROUS SULFATE) 325 (65 FE) MG PO TABS: 325.0000 mg | ORAL_TABLET | Freq: Every day | ORAL | 1 refills | Status: DC

## 2022-03-07 LAB — ESTROGENS, TOTAL: Estrogen: 456.3 pg/mL

## 2022-03-11 ENCOUNTER — Other Ambulatory Visit: Payer: Self-pay

## 2022-03-11 ENCOUNTER — Other Ambulatory Visit: Payer: Self-pay | Admitting: Nurse Practitioner

## 2022-03-11 ENCOUNTER — Encounter (HOSPITAL_BASED_OUTPATIENT_CLINIC_OR_DEPARTMENT_OTHER): Payer: Self-pay

## 2022-03-11 ENCOUNTER — Ambulatory Visit (HOSPITAL_BASED_OUTPATIENT_CLINIC_OR_DEPARTMENT_OTHER)
Admission: RE | Admit: 2022-03-11 | Discharge: 2022-03-11 | Disposition: A | Discharge status: Home / Self Care | Payer: 59 | Source: Ambulatory Visit | Attending: Nurse Practitioner | Admitting: Nurse Practitioner

## 2022-03-11 DIAGNOSIS — Z1231 Encounter for screening mammogram for malignant neoplasm of breast: Secondary | ICD-10-CM

## 2022-03-12 ENCOUNTER — Encounter: Payer: Self-pay | Admitting: Nurse Practitioner

## 2022-03-12 DIAGNOSIS — R21 Rash and other nonspecific skin eruption: Secondary | ICD-10-CM

## 2022-03-13 MED ORDER — HYDROXYZINE PAMOATE 25 MG PO CAPS: 25.0000 mg | ORAL_CAPSULE | Freq: Two times a day (BID) | ORAL | 0 refills | Status: DC | PRN

## 2022-04-29 ENCOUNTER — Other Ambulatory Visit (INDEPENDENT_AMBULATORY_CARE_PROVIDER_SITE_OTHER): Payer: 59

## 2022-04-29 DIAGNOSIS — D508 Other iron deficiency anemias: Secondary | ICD-10-CM

## 2022-04-29 LAB — CBC
HCT: 36.7 % (ref 36.0–46.0)
Hemoglobin: 12.2 g/dL (ref 12.0–15.0)
MCHC: 33.2 g/dL (ref 30.0–36.0)
MCV: 74.9 fl — ABNORMAL LOW (ref 78.0–100.0)
Platelets: 255 10*3/uL (ref 150.0–400.0)
RBC: 4.9 Mil/uL (ref 3.87–5.11)
RDW: 17 % — ABNORMAL HIGH (ref 11.5–15.5)
WBC: 2.9 10*3/uL — ABNORMAL LOW (ref 4.0–10.5)

## 2022-04-29 LAB — IBC + FERRITIN
Ferritin: 5.1 ng/mL — ABNORMAL LOW (ref 10.0–291.0)
Iron: 29 ug/dL — ABNORMAL LOW (ref 42–145)
Saturation Ratios: 7.1 % — ABNORMAL LOW (ref 20.0–50.0)
TIBC: 410.2 ug/dL (ref 250.0–450.0)
Transferrin: 293 mg/dL (ref 212.0–360.0)

## 2022-04-30 ENCOUNTER — Telehealth: Payer: Self-pay | Admitting: Nurse Practitioner

## 2022-04-30 NOTE — Telephone Encounter (Signed)
-----   Message from [place] sent at 04/30/2022  2:51 PM EDT ----- ?Patient advised. Patient states she has been taking Iron pill on the average 5 days a week, on the weekends its been off and on due to the routine. Patient is not having any blood in the stool or urine, no dizziness. She is fatigue but figured that was due to work and taking care of her kids.  ?

## 2022-04-30 NOTE — Telephone Encounter (Signed)
If she has been taking it daily then she needs to double it to twice a day on the 5 days she does it for the next month and then see how she is feeling ?

## 2022-05-01 NOTE — Telephone Encounter (Signed)
Patient advised.

## 2022-08-05 IMAGING — MG DIGITAL SCREENING BREAST BILAT IMPLANT W/ TOMO W/ CAD
8 of 12 series · 8 of 28 positions shown · non-contrast
Comparison: None.

CLINICAL DATA: Screening.

EXAM:
DIGITAL SCREENING BILATERAL MAMMOGRAM WITH IMPLANTS, CAD AND
TOMOSYNTHESIS
TECHNIQUE: Bilateral screening digital craniocaudal and mediolateral oblique
mammograms were obtained. Bilateral screening digital breast
tomosynthesis was performed. The images were evaluated with
computer-aided detection. Standard and/or implant displaced views
were performed.

[R CC]
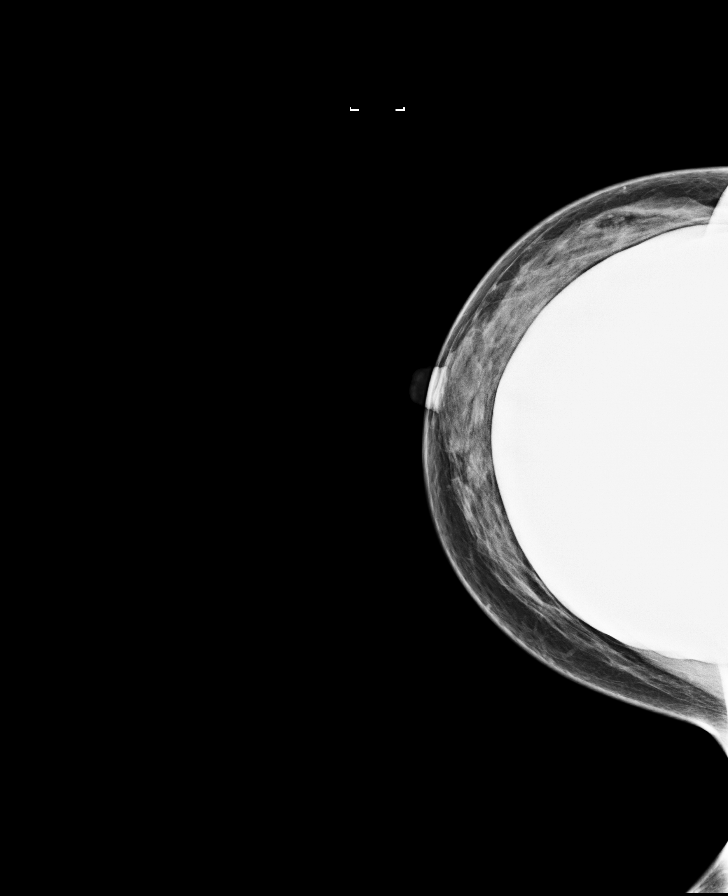

[L CC]
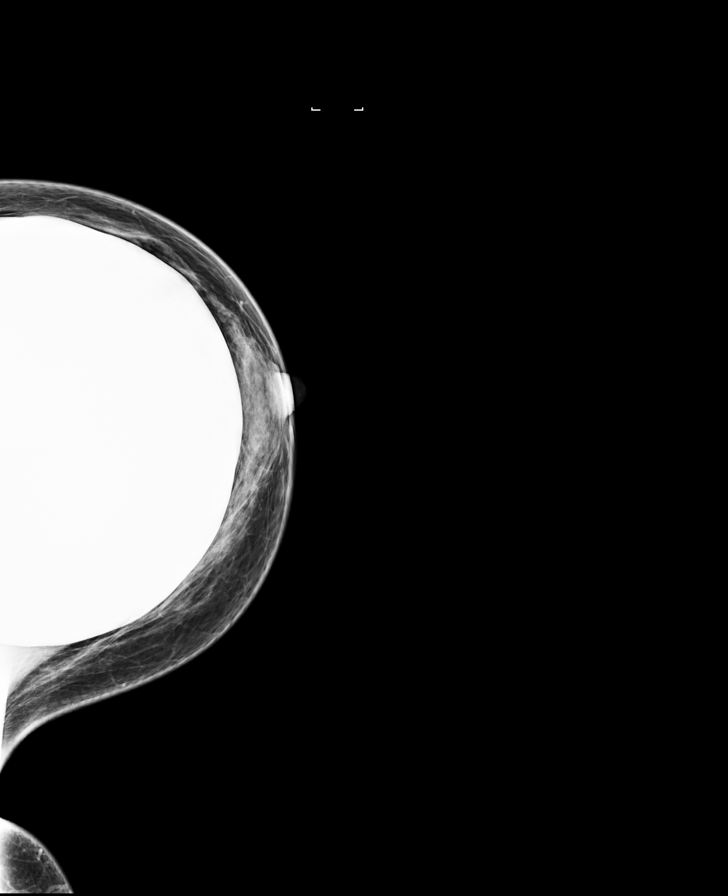

[L MLO]
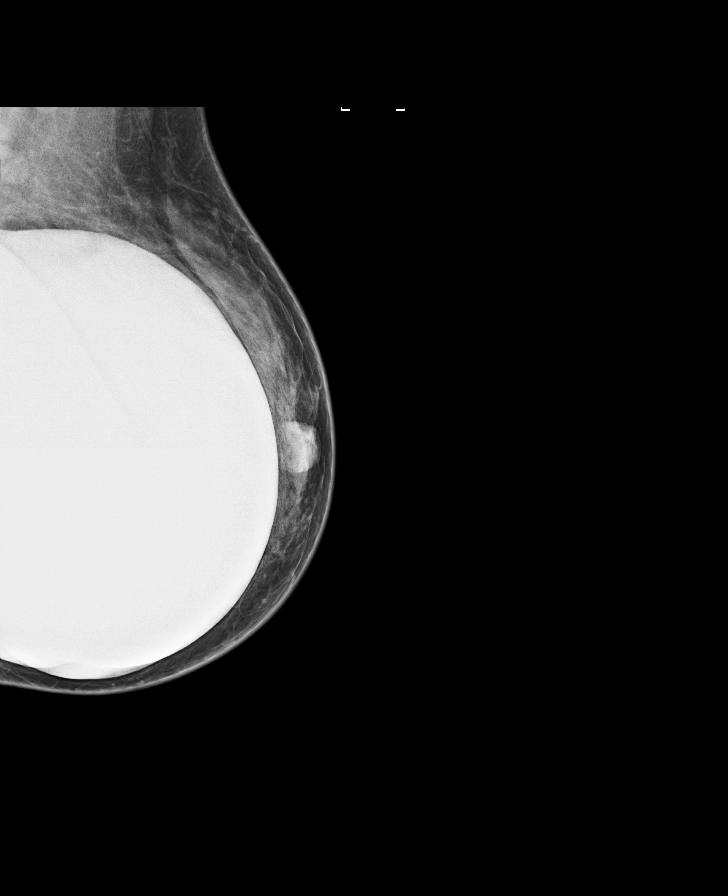

[R MLO]
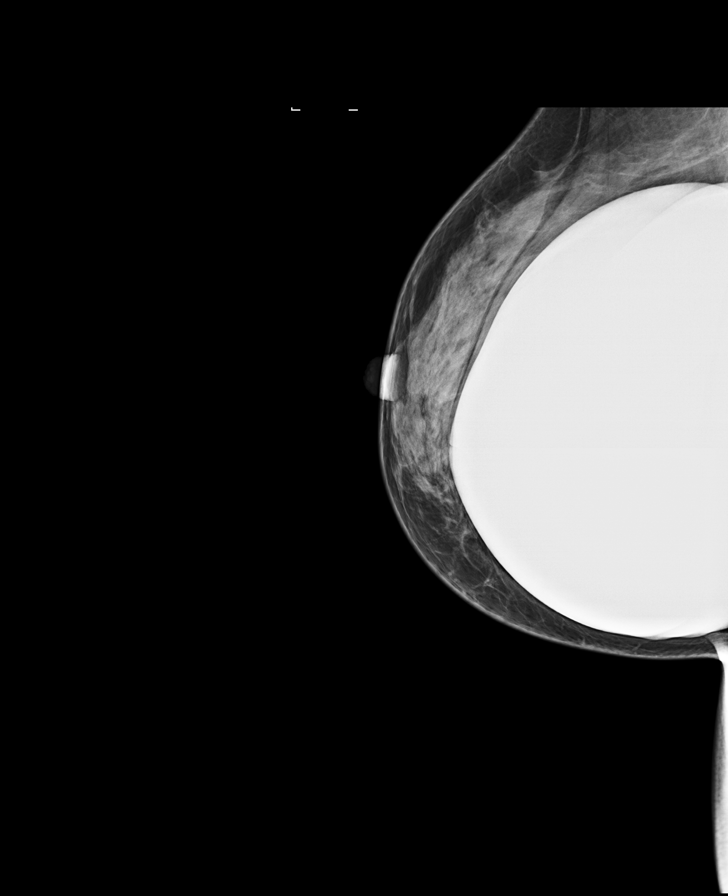

[L CC synth-2D]
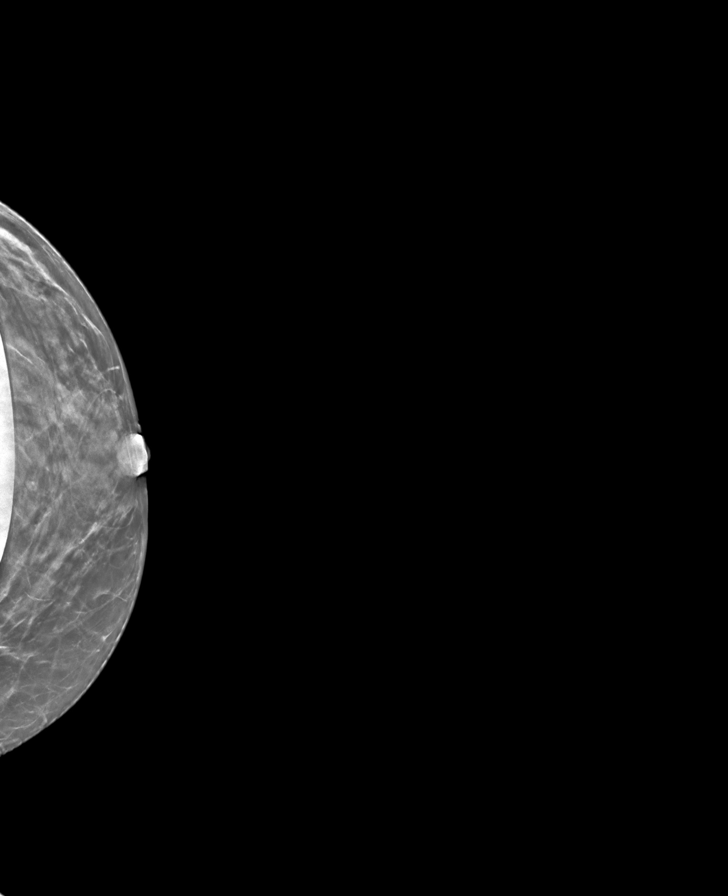

[L MLO synth-2D]
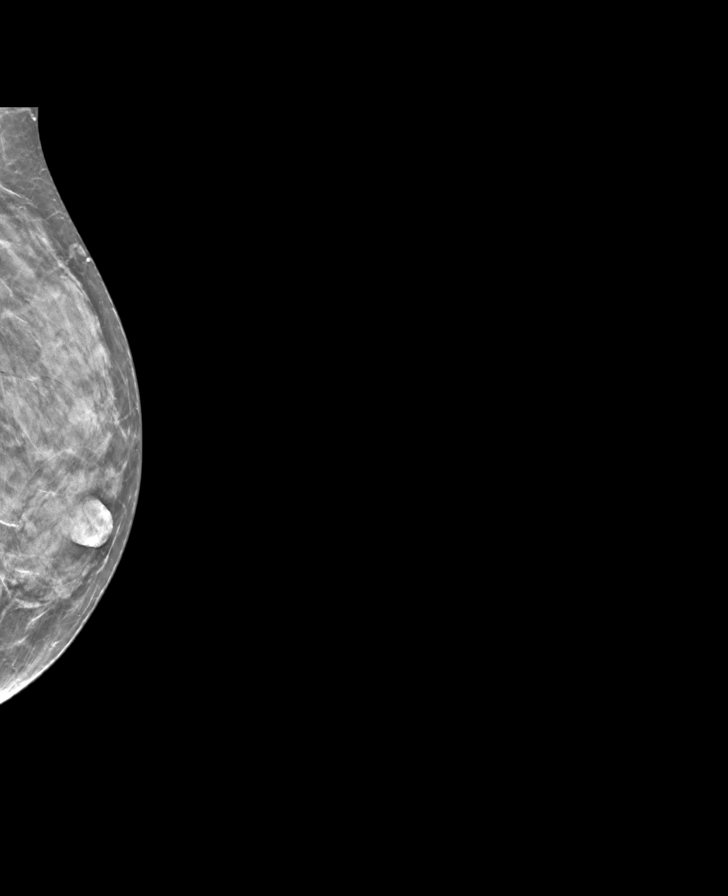

[R MLO synth-2D]
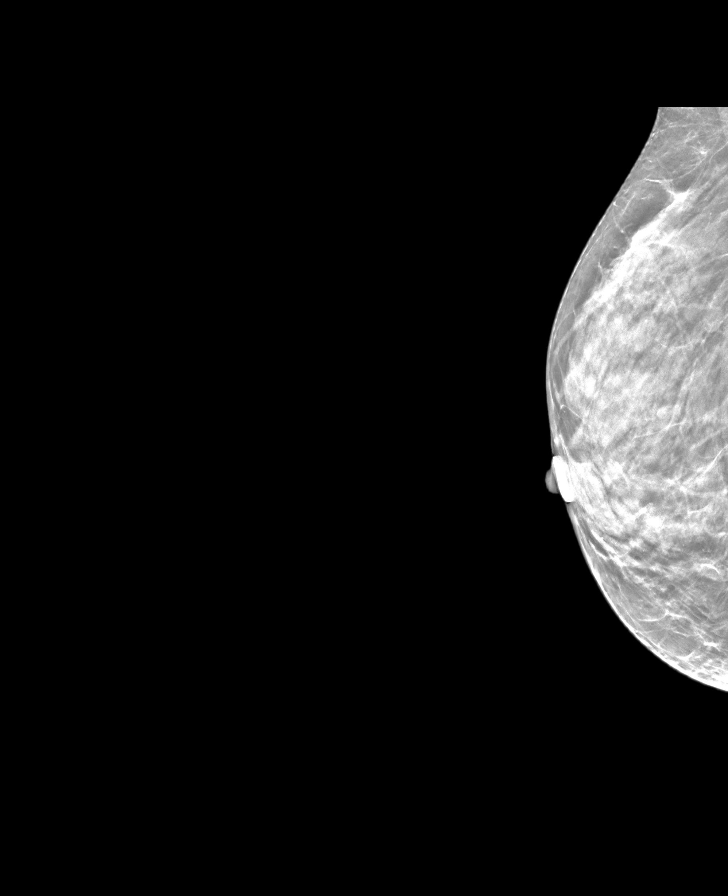

[R CC synth-2D]
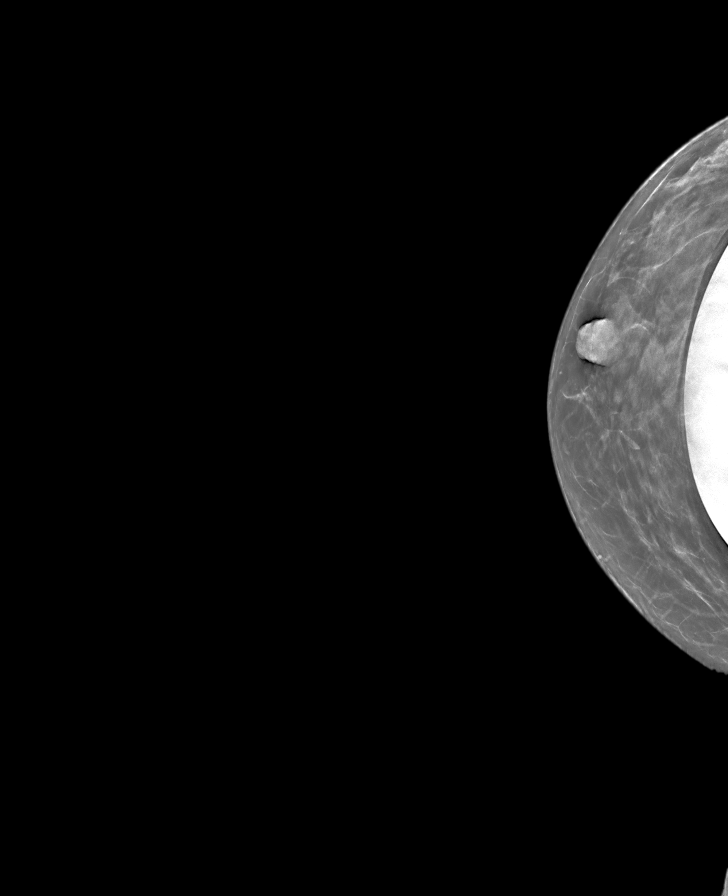

[8 of 28 positions shown; findings below may reference images not displayed]

ACR Breast Density Category d: The breast tissue is extremely dense,
which lowers the sensitivity of mammography.
FINDINGS: The patient has bilateral retropectoral silicone implants. There are
no findings suspicious for malignancy.
IMPRESSION: No mammographic evidence of malignancy. A result letter of this
screening mammogram will be mailed directly to the patient.

RECOMMENDATION:
Screening mammogram at age 40. (Code:XV-R-3TG)

BI-RADS CATEGORY  1:  Negative.

## 2022-08-29 ENCOUNTER — Ambulatory Visit (INDEPENDENT_AMBULATORY_CARE_PROVIDER_SITE_OTHER): Payer: 59 | Admitting: Nurse Practitioner

## 2022-08-29 ENCOUNTER — Encounter: Payer: Self-pay | Admitting: Nurse Practitioner

## 2022-08-29 VITALS — BP 132/84 | HR 80 | Temp 97.8°F | Ht 66.5 in | Wt 171.2 lb

## 2022-08-29 DIAGNOSIS — D508 Other iron deficiency anemias: Secondary | ICD-10-CM

## 2022-08-29 DIAGNOSIS — Z0000 Encounter for general adult medical examination without abnormal findings: Secondary | ICD-10-CM

## 2022-08-29 DIAGNOSIS — Z1159 Encounter for screening for other viral diseases: Secondary | ICD-10-CM | POA: Diagnosis not present

## 2022-08-29 DIAGNOSIS — E663 Overweight: Secondary | ICD-10-CM | POA: Diagnosis not present

## 2022-08-29 LAB — LIPID PANEL
Cholesterol: 175 mg/dL (ref 0–200)
HDL: 51.2 mg/dL (ref >39.00)
LDL Cholesterol: 108 mg/dL — ABNORMAL HIGH (ref 0–99)
NonHDL: 123.94
Total CHOL/HDL Ratio: 3
Triglycerides: 79 mg/dL (ref 0.0–149.0)
VLDL: 15.8 mg/dL (ref 0.0–40.0)

## 2022-08-29 LAB — CBC
HCT: 40 % (ref 36.0–46.0)
Hemoglobin: 13.6 g/dL (ref 12.0–15.0)
MCHC: 34 g/dL (ref 30.0–36.0)
MCV: 76.2 fl — ABNORMAL LOW (ref 78.0–100.0)
Platelets: 259 10*3/uL (ref 150.0–400.0)
RBC: 5.24 Mil/uL — ABNORMAL HIGH (ref 3.87–5.11)
RDW: 16.6 % — ABNORMAL HIGH (ref 11.5–15.5)
WBC: 3.7 10*3/uL — ABNORMAL LOW (ref 4.0–10.5)

## 2022-08-29 LAB — COMPREHENSIVE METABOLIC PANEL
ALT: 11 U/L (ref 0–35)
AST: 13 U/L (ref 0–37)
Albumin: 4.1 g/dL (ref 3.5–5.2)
Alkaline Phosphatase: 66 U/L (ref 39–117)
BUN: 6 mg/dL (ref 6–23)
CO2: 29 mEq/L (ref 19–32)
Calcium: 9.7 mg/dL (ref 8.4–10.5)
Chloride: 103 mEq/L (ref 96–112)
Creatinine, Ser: 0.97 mg/dL (ref 0.40–1.20)
GFR: 74.99 mL/min (ref >60.00)
Glucose, Bld: 78 mg/dL (ref 70–99)
Potassium: 4.9 mEq/L (ref 3.5–5.1)
Sodium: 137 mEq/L (ref 135–145)
Total Bilirubin: 0.7 mg/dL (ref 0.2–1.2)
Total Protein: 7.8 g/dL (ref 6.0–8.3)

## 2022-08-29 LAB — IBC + FERRITIN
Ferritin: 9.6 ng/mL — ABNORMAL LOW (ref 10.0–291.0)
Iron: 22 ug/dL — ABNORMAL LOW (ref 42–145)
Saturation Ratios: 5.3 % — ABNORMAL LOW (ref 20.0–50.0)
TIBC: 411.6 ug/dL (ref 250.0–450.0)
Transferrin: 294 mg/dL (ref 212.0–360.0)

## 2022-08-29 LAB — TSH: TSH: 0.69 u[IU]/mL (ref 0.35–5.50)

## 2022-08-29 NOTE — Progress Notes (Signed)
Established Patient Office Visit  Subjective   Patient ID: Lauren Solis, female    DOB: 1986/03/03  Age: 36 y.o. MRN: [id number]  Chief Complaint  Patient presents with   Annual Exam    HPI for complete physical and follow up of chronic conditions.  Immunizations: -Tetanus:2023 -Influenza: states that she normally does not.  -Covid-19: pfizer x2 -Shingles: Too young -Pneumonia: Too young  -HPV: unsure  Diet: Fair diet. States that she is doing 3 meals a day and will do 2 small snacks. Plenty of water. Cut out soda.  Exercise: Walking twice a day 3-35mles in the morning with her parents.Then in the evening she will walk again . 3-4 times a week.  Eye exam:  Has glasses. Used [employer] sees yearly Dental exam: Completes semi-annually   Pap Smear: Completed in 2021 Mammogram: Completed in 2023, will repeat at age [age]for surveillance  Colonoscopy: Too young, currently average risk Lung Cancer Screening: N/A Dexa: Too young  Sleep: goes to bed around 09-1029 and get up 545-6 most of the time rested. Does not snore  IDA: been off iron for approx 2 weeks.  States she lost the medication    Review of Systems  Constitutional:  Negative for chills and fever.  Respiratory:  Negative for shortness of breath.   Cardiovascular:  Negative for chest pain and leg swelling.  Gastrointestinal:  Negative for abdominal pain, constipation, diarrhea, nausea and vomiting.       BM daily  Genitourinary:  Negative for dysuria and hematuria.  Neurological:  Negative for tingling and headaches.  Psychiatric/Behavioral:  Negative for hallucinations and suicidal ideas.       Objective:     BP 132/84   Pulse 80   Temp 97.8 F (36.6 C) (Temporal)   Ht 5' 6.5" (1.689 m)   Wt 171 lb 3.2 oz (77.7 kg)   LMP 08/28/2022 (Exact Date)   SpO2 97%   BMI 27.22 kg/m  BP Readings from Last 3 Encounters:  08/29/22 132/84  02/26/22 118/86  03/15/21 127/82   Wt Readings from Last 3 Encounters:   08/29/22 171 lb 3.2 oz (77.7 kg)  02/26/22 168 lb 7 oz (76.4 kg)  03/15/21 170 lb (77.1 kg)      Physical Exam Vitals and nursing note reviewed.  Constitutional:      Appearance: Normal appearance.  HENT:     Right Ear: Tympanic membrane, ear canal and external ear normal.     Left Ear: Tympanic membrane, ear canal and external ear normal.     Mouth/Throat:     Mouth: Mucous membranes are moist.     Pharynx: Oropharynx is clear.  Eyes:     Extraocular Movements: Extraocular movements intact.     Pupils: Pupils are equal, round, and reactive to light.  Cardiovascular:     Rate and Rhythm: Normal rate and regular rhythm.     Pulses: Normal pulses.     Heart sounds: Normal heart sounds.  Pulmonary:     Effort: Pulmonary effort is normal.     Breath sounds: Normal breath sounds.  Abdominal:     General: Bowel sounds are normal. There is no distension.     Palpations: There is no mass.     Tenderness: There is no abdominal tenderness.     Hernia: No hernia is present.  Musculoskeletal:     Right lower leg: No edema.     Left lower leg: No edema.  Lymphadenopathy:  Cervical: No cervical adenopathy.  Skin:    General: Skin is warm.  Neurological:     General: No focal deficit present.     Mental Status: She is alert.     Deep Tendon Reflexes:     Reflex Scores:      Bicep reflexes are 2+ on the right side and 2+ on the left side.      Patellar reflexes are 2+ on the right side and 2+ on the left side.    Comments: Bilateral upper and lower extremity strength 5/5  Psychiatric:        Mood and Affect: Mood normal.        Behavior: Behavior normal.        Thought Content: Thought content normal.        Judgment: Judgment normal.      No results found for any visits on 08/29/22.    The ASCVD Risk score (Arnett DK, et al., 2019) failed to calculate for the following reasons:   The 2019 ASCVD risk score is only valid for ages 39 to 29    Assessment & Plan:    Problem List Items Addressed This Visit       Other   Preventative health care - Primary    Discussed age-appropriate immunizations and screening exams.      Relevant Orders   CBC   Comprehensive metabolic panel   Lipid panel   TSH   Iron deficiency anemia secondary to inadequate dietary iron intake    Patient has been on iron approximate 2 weeks.  Pending lab results.      Relevant Orders   IBC + Ferritin   Need for hepatitis C screening test    Lab order test ordered today, pending result.      Relevant Orders   Hepatitis C antibody   Lipid panel   Overweight    Continue working on lifestyle modifications.       Return in about 1 year (around 08/30/2023) for CPE and Labs.    Romilda Garret, NP

## 2022-08-29 NOTE — Patient Instructions (Signed)
Nice to see you today I will be in touch with the labs once I have them Follow up with me in 1 year for your next physical, sooner if you need me

## 2022-08-29 NOTE — Assessment & Plan Note (Signed)
Lab order test ordered today, pending result.

## 2022-08-29 NOTE — Assessment & Plan Note (Signed)
Continue working on lifestyle modifications.

## 2022-08-29 NOTE — Assessment & Plan Note (Signed)
Patient has been on iron approximate 2 weeks.  Pending lab results.

## 2022-08-29 NOTE — Assessment & Plan Note (Signed)
Discussed age-appropriate immunizations and screening exams.

## 2022-09-01 ENCOUNTER — Other Ambulatory Visit: Payer: Self-pay | Admitting: Nurse Practitioner

## 2022-09-01 DIAGNOSIS — D508 Other iron deficiency anemias: Secondary | ICD-10-CM

## 2022-09-01 LAB — HEPATITIS C ANTIBODY: Hepatitis C Ab: NONREACTIVE

## 2022-09-01 MED ORDER — IRON (FERROUS SULFATE) 325 (65 FE) MG PO TABS: 325.0000 mg | ORAL_TABLET | Freq: Every day | ORAL | 1 refills | Status: DC

## 2022-10-27 ENCOUNTER — Other Ambulatory Visit: Payer: Self-pay | Admitting: Nurse Practitioner

## 2022-10-27 ENCOUNTER — Ambulatory Visit: Payer: 59 | Admitting: Dermatology

## 2022-10-27 DIAGNOSIS — D508 Other iron deficiency anemias: Secondary | ICD-10-CM

## 2022-11-05 ENCOUNTER — Other Ambulatory Visit: Payer: 59

## 2023-02-12 ENCOUNTER — Telehealth: Payer: Managed Care, Other (non HMO) | Admitting: Primary Care

## 2023-02-12 ENCOUNTER — Encounter: Payer: Self-pay | Admitting: Primary Care

## 2023-02-12 VITALS — Ht 66.5 in

## 2023-02-12 DIAGNOSIS — U071 COVID-19: Secondary | ICD-10-CM | POA: Diagnosis not present

## 2023-02-12 MED ORDER — NIRMATRELVIR/RITONAVIR (PAXLOVID)TABLET: 3.0000 | ORAL_TABLET | Freq: Two times a day (BID) | ORAL | 0 refills | Status: AC

## 2023-02-12 NOTE — Assessment & Plan Note (Addendum)
Qualifies for antiviral treatment given history of anemia. Reviewed options with patient, she would like to pursue treatment.  Reviewed GFR from September 2023.  Rx for Paxlovid sent to pharmacy. Discussed use of Tylenol/Ibuprofen, Delsym/Robitussin. Discussed quarantine and post quarantine CDC guidelines.   Follow up PRN.

## 2023-02-12 NOTE — Progress Notes (Signed)
Patient ID: Lauren Solis, female    DOB: [birth year], 37 y.o.   MRN: [id number]  Virtual visit completed through [employer], a video enabled telemedicine application. Due to national recommendations of social distancing due to COVID-19, a virtual visit is felt to be most appropriate for this patient at this time. Reviewed limitations, risks, security and privacy concerns of performing a virtual visit and the availability of in person appointments. I also reviewed that there may be a patient responsible charge related to this service. The patient agreed to proceed.   Patient location: home Provider location: [facility], office Persons participating in this virtual visit: patient, provider   If any vitals were documented, they were collected by patient at home unless specified below.    Ht 5' 6.5" (1.689 m)   LMP  (LMP Unknown)   BMI 27.22 kg/m    CC: Covid-19 positive Subjective:   HPI: Lauren Solis is a 37 y.o. female patient of Romilda Garret, [place] with a history of anemia presenting on 02/12/2023 for Covid Positive (Positive Home test today/Symptoms started 2 days ago)  Symptom onset two days ago with cough. She then developed sore throat, headaches, rhinorrhea, body aches, fevers (101). She took a home Covid-19 test today which was positive.  She's been taking Cold and Flu gel caps without much improvement.   She denies shortness, nausea and vomiting, diarrhea. She is interested in antiviral treatment.        Relevant past medical, surgical, family and social history reviewed and updated as indicated. Interim medical history since our last visit reviewed. Allergies and medications reviewed and updated. Outpatient Medications Prior to Visit  Medication Sig Dispense Refill   hydrOXYzine (VISTARIL) 25 MG capsule Take 1 capsule (25 mg total) by mouth 2 (two) times daily as needed for itching. 60 capsule 0   Iron, Ferrous Sulfate, 325 (65 Fe) MG TABS Take 325 mg by mouth  daily. 30 tablet 1   No facility-administered medications prior to visit.     Per HPI unless specifically indicated in ROS section below Review of Systems  Constitutional:  Positive for fatigue and fever.  HENT:  Positive for congestion and rhinorrhea.   Respiratory:  Positive for cough.   Musculoskeletal:  Positive for myalgias.  Neurological:  Positive for headaches.   Objective:  Ht 5' 6.5" (1.689 m)   LMP  (LMP Unknown)   BMI 27.22 kg/m   Wt Readings from Last 3 Encounters:  08/29/22 171 lb 3.2 oz (77.7 kg)  02/26/22 168 lb 7 oz (76.4 kg)  03/15/21 170 lb (77.1 kg)       Physical exam: General: Alert and oriented x 3, no distress, does appear sickly  Pulmonary: Speaks in complete sentences without increased work of breathing, no cough during visit.  Psychiatric: Normal mood, thought content, and behavior.     Results for orders placed or performed in visit on 08/29/22  Hepatitis C antibody  Result Value Ref Range   Hepatitis C Ab NON-REACTIVE NON-REACTIVE  CBC  Result Value Ref Range   WBC 3.7 (L) 4.0 - 10.5 K/uL   RBC 5.24 (H) 3.87 - 5.11 Mil/uL   Platelets 259.0 150.0 - 400.0 K/uL   Hemoglobin 13.6 12.0 - 15.0 g/dL   HCT 40.0 36.0 - 46.0 %   MCV 76.2 (L) 78.0 - 100.0 fl   MCHC 34.0 30.0 - 36.0 g/dL   RDW 16.6 (H) 11.5 - 15.5 %  Comprehensive metabolic panel  Result Value  Ref Range   Sodium 137 135 - 145 mEq/L   Potassium 4.9 3.5 - 5.1 mEq/L   Chloride 103 96 - 112 mEq/L   CO2 29 19 - 32 mEq/L   Glucose, Bld 78 70 - 99 mg/dL   BUN 6 6 - 23 mg/dL   Creatinine, Ser 0.97 0.40 - 1.20 mg/dL   Total Bilirubin 0.7 0.2 - 1.2 mg/dL   Alkaline Phosphatase 66 39 - 117 U/L   AST 13 0 - 37 U/L   ALT 11 0 - 35 U/L   Total Protein 7.8 6.0 - 8.3 g/dL   Albumin 4.1 3.5 - 5.2 g/dL   GFR 74.99 >60.00 mL/min   Calcium 9.7 8.4 - 10.5 mg/dL  Lipid panel  Result Value Ref Range   Cholesterol 175 0 - 200 mg/dL   Triglycerides 79.0 0.0 - 149.0 mg/dL   HDL 51.20 >39.00  mg/dL   VLDL 15.8 0.0 - 40.0 mg/dL   LDL Cholesterol 108 (H) 0 - 99 mg/dL   Total CHOL/HDL Ratio 3    NonHDL 123.94   TSH  Result Value Ref Range   TSH 0.69 0.35 - 5.50 uIU/mL  IBC + Ferritin  Result Value Ref Range   Iron 22 (L) 42 - 145 ug/dL   Transferrin 294.0 212.0 - 360.0 mg/dL   Saturation Ratios 5.3 (L) 20.0 - 50.0 %   Ferritin 9.6 (L) 10.0 - 291.0 ng/mL   TIBC 411.6 250.0 - 450.0 mcg/dL   Assessment & Plan:   Problem List Items Addressed This Visit       Other   COVID-19 virus infection - Primary    Qualifies for antiviral treatment given history of anemia. Reviewed options with patient, she would like to pursue treatment.  Reviewed GFR from September 2023.  Rx for Paxlovid sent to pharmacy. Discussed use of Tylenol/Ibuprofen, Delsym/Robitussin. Discussed quarantine and post quarantine CDC guidelines.   Follow up PRN.      Relevant Medications   nirmatrelvir/ritonavir (PAXLOVID) 20 x 150 MG & 10 x 100MG TABS     Meds ordered this encounter  Medications   nirmatrelvir/ritonavir (PAXLOVID) 20 x 150 MG & 10 x 100MG TABS    Sig: Take 3 tablets by mouth 2 (two) times daily for 5 days.    Dispense:  30 tablet    Refill:  0    Order Specific Question:   Supervising Provider    Answer:   BEDSOLE, AMY E [[id number]]   No orders of the defined types were placed in this encounter.   I discussed the assessment and treatment plan with the patient. The patient was provided an opportunity to ask questions and all were answered. The patient agreed with the plan and demonstrated an understanding of the instructions. The patient was advised to call back or seek an in-person evaluation if the symptoms worsen or if the condition fails to improve as anticipated.  Follow up plan:  Start the Paxlovid medication for Covid. Take 3 capsules by mouth twice daily for 5 days.  Remain in quarantine for 3 more days.  It was a pleasure meeting you!   Pleas Koch, NP

## 2023-02-12 NOTE — Patient Instructions (Signed)
Start the Paxlovid medication for Covid. Take 3 capsules by mouth twice daily for 5 days.  Remain in quarantine for 3 more days.  It was a pleasure meeting you!

## 2023-07-02 ENCOUNTER — Ambulatory Visit (INDEPENDENT_AMBULATORY_CARE_PROVIDER_SITE_OTHER): Payer: Managed Care, Other (non HMO) | Admitting: Dermatology

## 2023-07-02 ENCOUNTER — Encounter: Payer: Self-pay | Admitting: Dermatology

## 2023-07-02 DIAGNOSIS — L209 Atopic dermatitis, unspecified: Secondary | ICD-10-CM

## 2023-07-02 DIAGNOSIS — L708 Other acne: Secondary | ICD-10-CM

## 2023-07-02 DIAGNOSIS — L700 Acne vulgaris: Secondary | ICD-10-CM

## 2023-07-02 DIAGNOSIS — L308 Other specified dermatitis: Secondary | ICD-10-CM

## 2023-07-02 MED ORDER — TRETINOIN 0.025 % EX CREA: TOPICAL_CREAM | Freq: Every day | CUTANEOUS | 4 refills | Status: AC

## 2023-07-02 MED ORDER — TRIAMCINOLONE ACETONIDE 0.1 % EX CREA: 1.0000 | TOPICAL_CREAM | Freq: Two times a day (BID) | CUTANEOUS | 2 refills | Status: AC | PRN

## 2023-07-02 NOTE — Progress Notes (Signed)
   New Patient Visit   Subjective  Lauren Solis is a 37 y.o. female who presents for the following: Possible Eczema  Patient states she has possible eczema located at the neck, arms, and chest that she would like to have examined. The rash isn't present today but it comes and goes. She states she is unsure if it is caused by her overheating with exercise. Patient reports the areas have been there for  2-3  month(s). She reports the areas are bothersome. On scale 1-10 itchy scale she rates 10-10. She also tried [facility] She states that the areas have spread. Patient reports she has not previously been treated for these areas. Patient denies Hx of bx. Patient denies family history of skin cancer(s).    The following portions of the chart were reviewed this encounter and updated as appropriate: medications, allergies, medical history  Review of Systems:  No other skin or systemic complaints except as noted in HPI or Assessment and Plan.  Objective  Well appearing patient in no apparent distress; mood and affect are within normal limits.  A focused examination was performed of the following areas: Neck, Chest, B/L Arms  Relevant exam findings are noted in the Assessment and Plan.           Assessment & Plan   ATOPIC DERMATITIS Exam: Photo provided by pt Erythematous Scaly papules and some areas coalescing to plaques suggestive of eczema  Well controlled  Atopic dermatitis (eczema) is a chronic, relapsing, pruritic condition that can significantly affect quality of life. It is often associated with allergic rhinitis and/or asthma and can require treatment with topical medications, phototherapy, or in severe cases biologic injectable medication (Dupixent; Adbry) or Oral JAK inhibitors.  Treatment Plan: - We will plan to send in TMC 0.1%, Apply 2 times daily for 2 weeks then STOP, then used PRN for flares  ACNE VULGARIS Exam: Open comedones and inflammatory papules  Flared  Treatment  Plan: - We will prescribe Tretinoin 0.025%, Apply at night on M-W-F -Recommended to continue to use daily moisturizer to prevent excess drying of the skin -We will plan to follow up in 4 months  Recommend gentle skin care.  Other eczema  Related Medications triamcinolone cream (KENALOG) 0.1 % Apply 1 Application topically 2 (two) times daily as needed. Use for 2 weeks then STOP  Other acne  Related Medications tretinoin (RETIN-A) 0.025 % cream Apply topically at bedtime. APPLY M-W-F Night   Return in about 4 months (around 11/02/2023) for Acne F/U.  Documentation: I have reviewed the above documentation for accuracy and completeness, and I agree with the above.  Stasia Cavalier, am acting as scribe for Langston Reusing, DO.  Langston Reusing, DO

## 2023-07-02 NOTE — Patient Instructions (Addendum)
Thank you for visiting my office today. I appreciate your commitment to addressing and improving your skin health. Based on our discussion, here are the key instructions and recommendations for managing your skin condition:  - Medication: You will be prescribed Triamcinolone. Please apply this cream twice a day for up to two weeks. After this period, please discontinue use to allow your skin to rest.  You will also be prescribed tretinoin 0.025% cream to use 2-3 nights a week for your acne  - Lifestyle Adjustments: Continue avoiding perfumes and necklaces, as these could be potential triggers for your skin reactions.  - Monitoring and Follow-Up: Keep track of any potential triggers if the rash reappears. Note what you were in contact with during the last two to three days before the flare-up.  - Further Action: If symptoms persist or recur frequently, please schedule a follow-up appointment for further evaluation.  We have provided you with a prescription for Triamcinolone today to ensure you have it on hand should you need it. Please feel free to reach out if you have any questions or concerns about your treatment plan.   Due to recent changes in healthcare laws, you may see results of your pathology and/or laboratory studies on MyChart before the doctors have had a chance to review them. We understand that in some cases there may be results that are confusing or concerning to you. Please understand that not all results are received at the same time and often the doctors may need to interpret multiple results in order to provide you with the best plan of care or course of treatment. Therefore, we ask that you please give [place] 2 business days to thoroughly review all your results before contacting the office for clarification. Should we see a critical lab result, you will be contacted sooner.   If You Need Anything After Your Visit  If you have any questions or concerns for your doctor, please call  our main line at [phone] If no one answers, please leave a voicemail as directed and we will return your call as soon as possible. Messages left after 4 pm will be answered the following business day.   You may also send [place] a message via MyChart. We typically respond to MyChart messages within 1-2 business days.  For prescription refills, please ask your pharmacy to contact our office. Our fax number is [phone].  If you have an urgent issue when the clinic is closed that cannot wait until the next business day, you can page your doctor at the number below.    Please note that while we do our best to be available for urgent issues outside of office hours, we are not available 24/7.   If you have an urgent issue and are unable to reach [place], you may choose to seek medical care at your doctor's office, retail clinic, urgent care center, or emergency room.  If you have a medical emergency, please immediately call 911 or go to the emergency department. In the event of inclement weather, please call our main line at [phone] for an update on the status of any delays or closures.  Dermatology Medication Tips: Please keep the boxes that topical medications come in in order to help keep track of the instructions about where and how to use these. Pharmacies typically print the medication instructions only on the boxes and not directly on the medication tubes.   If your medication is too expensive, please contact our office at [phone] or  send [place] a message through MyChart.   We are unable to tell what your co-pay for medications will be in advance as this is different depending on your insurance coverage. However, we may be able to find a substitute medication at lower cost or fill out paperwork to get insurance to cover a needed medication.   If a prior authorization is required to get your medication covered by your insurance company, please allow [place] 1-2 business days to complete this  process.  Drug prices often vary depending on where the prescription is filled and some pharmacies may offer cheaper prices.  The website www.goodrx.com contains coupons for medications through different pharmacies. The prices here do not account for what the cost may be with help from insurance (it may be cheaper with your insurance), but the website can give you the price if you did not use any insurance.  - You can print the associated coupon and take it with your prescription to the pharmacy.  - You may also stop by our office during regular business hours and pick up a GoodRx coupon card.  - If you need your prescription sent electronically to a different pharmacy, notify our office through [facility] or by phone at [phone]

## 2023-07-15 ENCOUNTER — Ambulatory Visit: Payer: Managed Care, Other (non HMO) | Admitting: Nurse Practitioner

## 2023-07-15 ENCOUNTER — Encounter: Payer: Self-pay | Admitting: Nurse Practitioner

## 2023-07-15 VITALS — BP 118/80 | HR 68 | Temp 98.3°F | Ht 66.5 in | Wt 177.0 lb

## 2023-07-15 DIAGNOSIS — Z7184 Encounter for health counseling related to travel: Secondary | ICD-10-CM | POA: Diagnosis not present

## 2023-07-15 DIAGNOSIS — L709 Acne, unspecified: Secondary | ICD-10-CM

## 2023-07-15 DIAGNOSIS — Z2989 Encounter for other specified prophylactic measures: Secondary | ICD-10-CM | POA: Diagnosis not present

## 2023-07-15 DIAGNOSIS — E663 Overweight: Secondary | ICD-10-CM

## 2023-07-15 DIAGNOSIS — Z6828 Body mass index (BMI) 28.0-28.9, adult: Secondary | ICD-10-CM

## 2023-07-15 DIAGNOSIS — E7800 Pure hypercholesterolemia, unspecified: Secondary | ICD-10-CM

## 2023-07-15 MED ORDER — WEGOVY 0.25 MG/0.5ML ~~LOC~~ SOAJ: 0.2500 mg | SUBCUTANEOUS | 0 refills | Status: DC

## 2023-07-15 MED ORDER — ATOVAQUONE-PROGUANIL HCL 250-100 MG PO TABS: 1.0000 | ORAL_TABLET | Freq: Every day | ORAL | 0 refills | Status: DC

## 2023-07-15 NOTE — Assessment & Plan Note (Signed)
Has been seen and evaluated by rheumatology.  Patient currently on Retin-A cream.  We discussed the possibility of using a low-dose birth control to help with acne patient being above 35 and history of DVT we will hold off and give the dermatologist screening a chance to work.

## 2023-07-15 NOTE — Progress Notes (Signed)
Established Patient Office Visit  Subjective   Patient ID: Lauren Solis, female    DOB: 08-30-1986  Age: 37 y.o. MRN: [id number]  Chief Complaint  Patient presents with   Weight Loss    Pt would like to discuss weight loss medications. Would like to know if hormones have anything to do with weight gain.   Travel Consult    Travel to [place]. Wants to know about malaria medication or shots.     HPI  Travel consult: [place], [place] 08/3-08/12 and doing it for leisure.  She does have friends over there that wanted her to talk about malaria prophylaxis.  Obesity: states that she is interested in weight loss medication. States that she has done some research and just needs a little help getting started.  She does leave a fairly healthy lifestyle per her report  Diet: states that she is doing 3 meals a day and 2 snacks. She is doing healthy choices. Oatmela or eggs for breakfast, a snakck is oast or nuts and fresh food. Lunch is chikcen breast and veggie, dinner is spinach and a protein. States that she has  stopped eating out. States that she cut out all the sodas and is only doing water.   Exercise: states that she will walk 3 times a week at 8 miles a a time. States 2 days a week she will go [employer]. Weight training for approx 30 mins and an hour.  Acne: Patient did bring up her acne.  She has been seen by dermatology and put on Retin-A cream.  Started this month dermatology stated it could be hormonally related.  Patient was curious about doing medication to help with that.  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  Respiratory:  Negative for shortness of breath.   Cardiovascular:  Negative for chest pain.      Objective:     BP 118/80   Pulse 68   Temp 98.3 F (36.8 C) (Temporal)   Ht 5' 6.5" (1.689 m)   Wt 177 lb  (80.3 kg)   LMP 07/15/2023 (Exact Date) Comment: due to start today.  SpO2 98%   BMI 28.14 kg/m  BP Readings from Last 3 Encounters:  07/15/23 118/80  08/29/22 132/84  02/26/22 118/86   Wt Readings from Last 3 Encounters:  07/15/23 177 lb (80.3 kg)  08/29/22 171 lb 3.2 oz (77.7 kg)  02/26/22 168 lb 7 oz (76.4 kg)      Physical Exam Vitals and nursing note reviewed.  Constitutional:      Appearance: Normal appearance.  Cardiovascular:     Rate and Rhythm: Normal rate and regular rhythm.     Heart sounds: Normal heart sounds.  Pulmonary:     Effort: Pulmonary effort is normal.     Breath sounds: Normal breath sounds.  Neurological:     Mental Status: She is alert.      No results found for any visits on 07/15/23.    The ASCVD Risk score (Arnett DK, et al., 2019) failed to calculate for the following reasons:   The 2019 ASCVD risk score is only valid for ages 37 to 28    Assessment & Plan:   Problem List Items Addressed This Visit       Musculoskeletal and Integument   Acne  Has been seen and evaluated by rheumatology.  Patient currently on Retin-A cream.  We discussed the possibility of using a low-dose birth control to help with acne patient being above 35 and history of DVT we will hold off and give the dermatologist screening a chance to work.        Other   Overweight    Patient is exercising and aqua mount for which she is eating enough to.  Will see if that bound or Reginal Lutes is covered on her insurance.  BMI is above 27 with an elevated LDL      Need for malaria prophylaxis - Primary    Will place patient on Malarone.  Patient will start 2 days prior to departure and still in 1 week post return follow-up hypertension medication to check LFTs      Relevant Medications   atovaquone-proguanil (MALARONE) 250-100 MG TABS tablet   Travel advice encounter    Patient is traveling to [place] for leisure we discussed the possibility of  malaria, dengue fever, and yellow fever.  Also discussed traveler's diarrhea      Elevated LDL cholesterol level    Return if symptoms worsen or fail to improve.    Audria Nine, NP

## 2023-07-15 NOTE — Assessment & Plan Note (Signed)
Patient is exercising and aqua mount for which she is eating enough to.  Will see if that bound or Reginal Lutes is covered on her insurance.  BMI is above 27 with an elevated LDL

## 2023-07-15 NOTE — Assessment & Plan Note (Signed)
Will place patient on Malarone.  Patient will start 2 days prior to departure and still in 1 week post return follow-up hypertension medication to check LFTs

## 2023-07-15 NOTE — Patient Instructions (Addendum)
Nice to see you today Zepbound and wegovy are the weight loss injections  With the Malarone start 2 days before you leave and take it for a week after you return. Once you have completed the medication you will need a lab visit to check your liver functions   Dungue Fever is the viral illness spread by Mosquitoes

## 2023-07-15 NOTE — Assessment & Plan Note (Signed)
Patient is traveling to [place] for leisure we discussed the possibility of malaria, dengue fever, and yellow fever.  Also discussed traveler's diarrhea

## 2023-07-16 ENCOUNTER — Telehealth: Payer: Self-pay

## 2023-07-16 ENCOUNTER — Other Ambulatory Visit (HOSPITAL_COMMUNITY): Payer: Self-pay

## 2023-07-16 DIAGNOSIS — E7800 Pure hypercholesterolemia, unspecified: Secondary | ICD-10-CM

## 2023-07-16 DIAGNOSIS — E663 Overweight: Secondary | ICD-10-CM

## 2023-07-16 NOTE — Telephone Encounter (Signed)
Pharmacy Patient Advocate Encounter  Received notification from [facility] that Prior Authorization for [facility] 0.25MG /0.5ML auto-injectors has been APPROVED from 07/16/23 to 02/11/24. Ran test claim, Copay is $24.99.  PA #/Case ID/Reference #: [id number]

## 2023-07-16 NOTE — Telephone Encounter (Signed)
Pharmacy Patient Advocate Encounter   Received notification from CoverMyMeds that prior authorization for [facility] 0.25MG /0.5ML auto-injectors is required/requested.   Insurance verification completed.   The patient is insured through [facility] .   Per test claim: PA required; PA submitted to [facility] via CoverMyMeds Key/confirmation #/EOC [id number] Status is pending

## 2023-07-17 NOTE — Telephone Encounter (Signed)
Sent my chart to patient to make aware approved.

## 2023-08-06 ENCOUNTER — Encounter (INDEPENDENT_AMBULATORY_CARE_PROVIDER_SITE_OTHER): Payer: Self-pay

## 2023-08-06 MED ORDER — ZEPBOUND 2.5 MG/0.5ML ~~LOC~~ SOAJ: 2.5000 mg | SUBCUTANEOUS | 0 refills | Status: DC

## 2023-08-06 NOTE — Addendum Note (Signed)
Addended by: Eden Emms on: 08/06/2023 05:05 PM   Modules accepted: Orders

## 2023-08-06 NOTE — Telephone Encounter (Signed)
I can order the zepbound and see if it is covered under your insurance

## 2023-08-10 MED ORDER — WEGOVY 0.25 MG/0.5ML ~~LOC~~ SOAJ: 0.2500 mg | SUBCUTANEOUS | 0 refills | Status: DC

## 2023-08-10 NOTE — Addendum Note (Signed)
Addended by: Eden Emms on: 08/10/2023 02:06 PM   Modules accepted: Orders

## 2023-08-25 ENCOUNTER — Encounter: Payer: Self-pay | Admitting: Nurse Practitioner

## 2023-08-26 MED ORDER — WEGOVY 0.5 MG/0.5ML ~~LOC~~ SOAJ: 0.5000 mg | SUBCUTANEOUS | 0 refills | Status: DC

## 2023-08-27 MED ORDER — WEGOVY 0.5 MG/0.5ML ~~LOC~~ SOAJ: 0.5000 mg | SUBCUTANEOUS | 0 refills | Status: DC

## 2023-08-27 NOTE — Addendum Note (Signed)
Addended by: Eden Emms on: 08/27/2023 01:45 PM   Modules accepted: Orders

## 2023-09-01 ENCOUNTER — Encounter: Payer: Managed Care, Other (non HMO) | Admitting: Nurse Practitioner

## 2023-09-22 ENCOUNTER — Other Ambulatory Visit (HOSPITAL_COMMUNITY): Payer: Self-pay

## 2023-09-22 MED ORDER — WEGOVY 1 MG/0.5ML ~~LOC~~ SOAJ: 1.0000 mg | SUBCUTANEOUS | 0 refills | Status: DC

## 2023-09-22 NOTE — Addendum Note (Signed)
Addended by: Eden Emms on: 09/22/2023 01:11 PM   Modules accepted: Orders

## 2023-10-21 ENCOUNTER — Ambulatory Visit (INDEPENDENT_AMBULATORY_CARE_PROVIDER_SITE_OTHER): Payer: Managed Care, Other (non HMO) | Admitting: Nurse Practitioner

## 2023-10-21 ENCOUNTER — Encounter: Payer: Self-pay | Admitting: Nurse Practitioner

## 2023-10-21 VITALS — BP 120/78 | HR 90 | Temp 98.3°F | Ht 66.5 in | Wt 164.2 lb

## 2023-10-21 DIAGNOSIS — E663 Overweight: Secondary | ICD-10-CM

## 2023-10-21 DIAGNOSIS — Z6826 Body mass index (BMI) 26.0-26.9, adult: Secondary | ICD-10-CM

## 2023-10-21 DIAGNOSIS — Z713 Dietary counseling and surveillance: Secondary | ICD-10-CM | POA: Diagnosis not present

## 2023-10-21 MED ORDER — WEGOVY 1.7 MG/0.75ML ~~LOC~~ SOAJ: 1.7000 mg | SUBCUTANEOUS | 0 refills | Status: DC

## 2023-10-21 NOTE — Patient Instructions (Signed)
Nice to see you today I have sent in the increased dose of wegovy Follow up with me in 3 months, sooner if you need me

## 2023-10-21 NOTE — Assessment & Plan Note (Signed)
Patient is doing well on Wegovy.  Will plan to increase to 1.7 mg for 1 month and then 2.4 thereafter.  Patient's BMI is nearing the normal limit.  Did have discussion about we do not want the patient to get too small on medications.  She will continue working on lifestyle modifications inclusive of diet and exercise.

## 2023-10-21 NOTE — Progress Notes (Signed)
Established Patient Office Visit  Subjective   Patient ID: Lauren Solis, female    DOB: 1986-08-09  Age: 37 y.o. MRN: [id number]  Chief Complaint  Patient presents with   Follow-up    Medical mangement for [facility]. Pts states first use she had stomach ache and felt sleepy for 2 weeks but states medication is great now.     HPI  Overweight: patient is currenlty maintained on wegovy. Stat that she is doing well  on the medicaotin. States that the first week she took it she did have some tummy pain. States after that she felt tired but has resolved.  BM is almost daily  States that she is eating 3 meals a day but has reduced the protion size. States that egg and sauage patty for breakfast. Lunch is a kids meal. Dinner is fruits or vagable  States that she is doing a lot of water through the day States that she is walking with her mom a lot. States that she will do 3 miles a day with her mom    Review of Systems  Constitutional:  Negative for chills and fever.  Respiratory:  Negative for shortness of breath.   Cardiovascular:  Negative for chest pain.  Gastrointestinal:  Negative for abdominal pain, diarrhea, nausea and vomiting.  Neurological:  Negative for headaches.      Objective:     BP 120/78   Pulse 90   Temp 98.3 F (36.8 C) (Oral)   Ht 5' 6.5" (1.689 m)   Wt 164 lb 3.2 oz (74.5 kg)   SpO2 99%   BMI 26.11 kg/m  BP Readings from Last 3 Encounters:  10/21/23 120/78  07/15/23 118/80  08/29/22 132/84   Wt Readings from Last 3 Encounters:  10/21/23 164 lb 3.2 oz (74.5 kg)  07/15/23 177 lb (80.3 kg)  08/29/22 171 lb 3.2 oz (77.7 kg)   SpO2 Readings from Last 3 Encounters:  10/21/23 99%  07/15/23 98%  08/29/22 97%      Physical Exam Vitals and nursing note reviewed.  Constitutional:      Appearance: Normal appearance.  Cardiovascular:     Rate and Rhythm: Normal rate and regular rhythm.     Heart sounds: Normal heart sounds.  Pulmonary:     Effort:  Pulmonary effort is normal.     Breath sounds: Normal breath sounds.  Abdominal:     General: Bowel sounds are normal. There is no distension.     Palpations: There is no mass.     Tenderness: There is no abdominal tenderness.     Hernia: No hernia is present.  Neurological:     Mental Status: She is alert.      No results found for any visits on 10/21/23.    The ASCVD Risk score (Arnett DK, et al., 2019) failed to calculate for the following reasons:   The 2019 ASCVD risk score is only valid for ages 28 to 70    Assessment & Plan:   Problem List Items Addressed This Visit       Other   Encounter for weight loss counseling - Primary   Overweight    Patient is doing well on Wegovy.  Will plan to increase to 1.7 mg for 1 month and then 2.4 thereafter.  Patient's BMI is nearing the normal limit.  Did have discussion about we do not want the patient to get too small on medications.  She will continue working on lifestyle modifications inclusive of  diet and exercise.      Relevant Medications   Semaglutide-Weight Management (WEGOVY) 1.7 MG/0.75ML SOAJ    Return in about 3 months (around 01/21/2024) for weight recheck/wegov.    Audria Nine, NP

## 2023-11-02 ENCOUNTER — Encounter: Payer: Self-pay | Admitting: Dermatology

## 2023-11-02 ENCOUNTER — Ambulatory Visit: Payer: Managed Care, Other (non HMO) | Admitting: Dermatology

## 2023-11-02 DIAGNOSIS — L700 Acne vulgaris: Secondary | ICD-10-CM

## 2023-11-02 MED ORDER — SPIRONOLACTONE 100 MG PO TABS: 100.0000 mg | ORAL_TABLET | Freq: Every day | ORAL | 2 refills | Status: DC

## 2023-11-02 NOTE — Progress Notes (Unsigned)
   Follow-Up Visit   Subjective  Lauren Solis is a 37 y.o. female who presents for the following: acne  Patient present today for follow up visit for follow up. Patient was last evaluated on 07/02/23. Patient reports sxs are better since she has been using Tretinoin on M, W, and F. However she mentioned that she sometimes still have a breakout on her chin area around her cycle. Patient denies medication changes.  The following portions of the chart were reviewed this encounter and updated as appropriate: medications, allergies, medical history  Review of Systems:  No other skin or systemic complaints except as noted in HPI or Assessment and Plan.  Objective  Well appearing patient in no apparent distress; mood and affect are within normal limits.   A focused examination was performed of the following areas: face   Relevant exam findings are noted in the Assessment and Plan.             Assessment & Plan   ACNE VULGARIS Exam: Open comedones and inflammatory papules  Stable  Treatment Plan: - Spirolactone 100mg  Rx'd - take daily to help with hormonal flares on chin.  - Recommend trying to increase Tretinoin 0.025% M-F nightly. If not able to tolerate go back to M, W, F. - Discussed possibilities of increasing Tretinoin dose during the next visit.  - Follow up in 78mo      No follow-ups on file.    Documentation: I have reviewed the above documentation for accuracy and completeness, and I agree with the above.   I, Shirron Marcha Solders, CMA, am acting as scribe for [employer], DO.   Langston Reusing, DO

## 2023-11-02 NOTE — Patient Instructions (Addendum)
Hello Lauren Solis,  Thank you for visiting my office today. Your dedication to enhancing your skin health is greatly appreciated. Below is a summary of the essential instructions we covered during our consultation:  - Tretinoin 0.025%: Continue applying on Monday, Wednesday, and Friday. If your skin tolerates it well, consider increasing usage to Monday through Friday.   - Moisturizer: Use before and after tretinoin application to manage any dryness.  - Spironolactone 100 mg: Start taking one tablet daily with dinner to help prevent acne flares related to hormonal activity.   - Side Effects: Be mindful of potential side effects, including lightheadedness, dizziness, and slight menstrual irregularities, which should normalize over time. Ensure you drink plenty of water.  - Follow-Up: We will reassess your treatment at our next appointment scheduled for March.  Please do not hesitate to reach out via MyChart if you have any concerns or issues with your new regimen. I look forward to observing your progress in March.  Warm regards,  Dr. Langston Reusing,  Dermatology     Important Information  Due to recent changes in healthcare laws, you may see results of your pathology and/or laboratory studies on MyChart before the doctors have had a chance to review them. We understand that in some cases there may be results that are confusing or concerning to you. Please understand that not all results are received at the same time and often the doctors may need to interpret multiple results in order to provide you with the best plan of care or course of treatment. Therefore, we ask that you please give [place] 2 business days to thoroughly review all your results before contacting the office for clarification. Should we see a critical lab result, you will be contacted sooner.   If You Need Anything After Your Visit  If you have any questions or concerns for your doctor, please call our main line at  [phone] If no one answers, please leave a voicemail as directed and we will return your call as soon as possible. Messages left after 4 pm will be answered the following business day.   You may also send [place] a message via MyChart. We typically respond to MyChart messages within 1-2 business days.  For prescription refills, please ask your pharmacy to contact our office. Our fax number is [phone].  If you have an urgent issue when the clinic is closed that cannot wait until the next business day, you can page your doctor at the number below.    Please note that while we do our best to be available for urgent issues outside of office hours, we are not available 24/7.   If you have an urgent issue and are unable to reach [place], you may choose to seek medical care at your doctor's office, retail clinic, urgent care center, or emergency room.  If you have a medical emergency, please immediately call 911 or go to the emergency department. In the event of inclement weather, please call our main line at [phone] for an update on the status of any delays or closures.  Dermatology Medication Tips: Please keep the boxes that topical medications come in in order to help keep track of the instructions about where and how to use these. Pharmacies typically print the medication instructions only on the boxes and not directly on the medication tubes.   If your medication is too expensive, please contact our office at [phone] or send [place] a message through MyChart.   We are unable  to tell what your co-pay for medications will be in advance as this is different depending on your insurance coverage. However, we may be able to find a substitute medication at lower cost or fill out paperwork to get insurance to cover a needed medication.   If a prior authorization is required to get your medication covered by your insurance company, please allow [place] 1-2 business days to complete this process.  Drug  prices often vary depending on where the prescription is filled and some pharmacies may offer cheaper prices.  The website www.goodrx.com contains coupons for medications through different pharmacies. The prices here do not account for what the cost may be with help from insurance (it may be cheaper with your insurance), but the website can give you the price if you did not use any insurance.  - You can print the associated coupon and take it with your prescription to the pharmacy.  - You may also stop by our office during regular business hours and pick up a GoodRx coupon card.  - If you need your prescription sent electronically to a different pharmacy, notify our office through [facility] or by phone at [phone]

## 2023-11-17 ENCOUNTER — Encounter: Payer: Self-pay | Admitting: Nurse Practitioner

## 2023-11-18 MED ORDER — WEGOVY 2.4 MG/0.75ML ~~LOC~~ SOAJ: 2.4000 mg | SUBCUTANEOUS | 0 refills | Status: DC

## 2023-12-14 ENCOUNTER — Ambulatory Visit: Payer: Self-pay | Admitting: Nurse Practitioner

## 2023-12-14 NOTE — Telephone Encounter (Signed)
Noted  

## 2023-12-14 NOTE — Telephone Encounter (Signed)
Copied from CRM [id number]. Topic: Clinical - Red Word Triage >> Dec 14, 2023  9:23 AM Elizebeth Brooking wrote: Red Word that prompted transfer to Nurse Triage: Patient husband has called in stating patient has had a very bad cold, chest is heavy and hurting when she breaths in  Chief Complaint: cold like symptoms Symptoms: cough, runny nose with congetstion, sore throat, can't talk Frequency: constant Pertinent Negatives: Patient denies fever, sob without coughing Disposition: [] ED /[] Urgent Care (no appt availability in office) / [x] Appointment(In office/virtual)/ []  [place] [facility]/ [] Home Care/ [] Refused Recommended Disposition /[] [place] [facility]/ []  Follow-up with PCP Additional Notes: patient's husband called in with c/o wife with cold like symptoms  stated a couple days ago.  C/o sore throat, nasal congestion and runny nose, cough that makes it difficult to breathe during coughing. Apt. Made for tomorrow.  Instructed to go to er if becomes worse.   Reason for Disposition  [1] SEVERE sore throat AND [2] present > 24 hours  Answer Assessment - Initial Assessment Questions 1. ONSET: "When did the nasal discharge start?"     Congestion; 3-4 days 3. COUGH: "Do you have a cough?" If Yes, ask: "Describe the color of your sputum" (clear, white, yellow, green)     Coughs and inhales results in chest pain 4. RESPIRATORY DISTRESS: "Describe your breathing."      Not distressed.  5. FEVER: "Do you have a fever?" If Yes, ask: "What is your temperature, how was it measured, and when did it start?"     no 6. SEVERITY: "Overall, how bad are you feeling right now?" (e.g., doesn't interfere with normal activities, staying home from school/work, staying in bed)      Sounds like she's struggling when she coughs.  7. OTHER SYMPTOMS: "Do you have any other symptoms?" (e.g., sore throat, earache, wheezing, vomiting)     Sore throat, vomited yesterday.  Protocols used: Common Cold-A-AH

## 2023-12-15 ENCOUNTER — Encounter: Payer: Self-pay | Admitting: Internal Medicine

## 2023-12-15 ENCOUNTER — Ambulatory Visit (INDEPENDENT_AMBULATORY_CARE_PROVIDER_SITE_OTHER): Payer: Managed Care, Other (non HMO) | Admitting: Internal Medicine

## 2023-12-15 VITALS — BP 104/70 | HR 88 | Temp 98.6°F | Ht 66.5 in | Wt 146.0 lb

## 2023-12-15 DIAGNOSIS — J0100 Acute maxillary sinusitis, unspecified: Secondary | ICD-10-CM | POA: Diagnosis not present

## 2023-12-15 MED ORDER — DOXYCYCLINE HYCLATE 100 MG PO TABS: 100.0000 mg | ORAL_TABLET | Freq: Two times a day (BID) | ORAL | 1 refills | Status: DC

## 2023-12-15 MED ORDER — BENZONATATE 200 MG PO CAPS: 200.0000 mg | ORAL_CAPSULE | Freq: Three times a day (TID) | ORAL | 0 refills | Status: DC | PRN

## 2023-12-15 NOTE — Progress Notes (Signed)
Subjective:    Patient ID: Lauren Solis, female    DOB: 1986/11/13, 37 y.o.   MRN: [id number]  HPI Here due to respiratory infection  About 1.5 weeks ago Facial pressure started and congestion Some cough and sore throat No fever Now with a lot of green mucous Now feels pain in her chest with coughing Head congestion Some sweats at night Some sense of SOB at night Voice is off  COVID negative  OTC cold meds and mucinex-D--hard to tell if it helped (except the night one may have helped cough)  Current Outpatient Medications on File Prior to Visit  Medication Sig Dispense Refill   hydrOXYzine (VISTARIL) 25 MG capsule Take 1 capsule (25 mg total) by mouth 2 (two) times daily as needed for itching. 60 capsule 0   Iron, Ferrous Sulfate, 325 (65 Fe) MG TABS Take 325 mg by mouth daily. 30 tablet 1   Semaglutide-Weight Management (WEGOVY) 2.4 MG/0.75ML SOAJ Inject 2.4 mg into the skin once a week. 9 mL 0   spironolactone (ALDACTONE) 100 MG tablet Take 1 tablet (100 mg total) by mouth daily. 30 tablet 2   tretinoin (RETIN-A) 0.025 % cream Apply topically at bedtime. APPLY M-W-F Night 45 g 4   triamcinolone cream (KENALOG) 0.1 % Apply 1 Application topically 2 (two) times daily as needed. Use for 2 weeks then STOP 80 g 2   No current facility-administered medications on file prior to visit.    No Known Allergies  Past Medical History:  Diagnosis Date   DVT (deep venous thrombosis) (HCC)    Medical history non-contributory    Varicose veins     Past Surgical History:  Procedure Laterality Date   BILATERAL SALPINGECTOMY Bilateral 02/13/2015   Procedure: BILATERAL SALPINGECTOMY;  Surgeon: Konrad Felix, MD;  Location: [facility];  Service: Gynecology;  Laterality: Bilateral;   BREAST ENHANCEMENT SURGERY  11/06/2019   BREAST SURGERY     OOPHORECTOMY  2010   OOPHORECTOMY     not sure which side   TUBAL LIGATION Bilateral 02/13/2015   Procedure: POST PARTUM TUBAL LIGATION;   Surgeon: Konrad Felix, MD;  Location: [facility];  Service: Gynecology;  Laterality: Bilateral;    Family History  Problem Relation Age of Onset   Hypertension Mother    Diabetes Father    Kidney disease Maternal Grandmother    Diabetes Paternal Grandfather    Cancer Maternal Aunt        Breast   Diabetes Maternal Aunt    Diabetes Maternal Uncle     Social History   Socioeconomic History   Marital status: Married    Spouse name: Not on file   Number of children: 2   Years of education: Not on file   Highest education level: Not on file  Occupational History   Not on file  Tobacco Use   Smoking status: Never   Smokeless tobacco: Never  Vaping Use   Vaping status: Never Used  Substance and Sexual Activity   Alcohol use: No    Alcohol/week: 0.0 standard drinks of alcohol   Drug use: No   Sexual activity: Yes    Partners: Male    Birth control/protection: None  Other Topics Concern   Not on file  Social History Narrative   2 bio and 3 step children      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  HENT:     Head:     Comments: Maxillary tenderness    Right Ear: Tympanic membrane and ear canal normal.     Left Ear: Tympanic membrane and ear canal normal.     Mouth/Throat:     Pharynx: No oropharyngeal exudate or posterior oropharyngeal erythema.  Pulmonary:     Effort: Pulmonary effort is normal.     Breath sounds: Normal breath sounds. No wheezing or rales.  Musculoskeletal:     Cervical  back: Neck supple.  Lymphadenopathy:     Cervical: No cervical adenopathy.  Neurological:     Mental Status: She is alert.            Assessment & Plan:

## 2023-12-15 NOTE — Assessment & Plan Note (Signed)
Sick for about 10 days Sinus symptoms and drainage predominate Will treat with doxycycline in case this could be early atypical infection---100 bid x 7 days (with refill) Analgesics and will Rx benzonatate

## 2023-12-18 ENCOUNTER — Encounter: Payer: Self-pay | Admitting: Nurse Practitioner

## 2023-12-21 ENCOUNTER — Other Ambulatory Visit: Payer: Self-pay | Admitting: Nurse Practitioner

## 2023-12-21 MED ORDER — WEGOVY 2.4 MG/0.75ML ~~LOC~~ SOAJ: 2.4000 mg | SUBCUTANEOUS | 0 refills | Status: DC

## 2023-12-30 ENCOUNTER — Telehealth: Payer: Self-pay

## 2023-12-30 NOTE — Telephone Encounter (Signed)
 Copied from CRM [id number]. Topic: Clinical - Prescription Issue >> Dec 30, 2023  9:22 AM Merlynn A wrote: Reason for CRM: Patient called in regarding Semaglutide -Weight Management (WEGOVY ) 2.4 MG/0.75ML SOAJ. Patient wasadvised by pharmacy that a prior authorization was needed in order to process refill. I advised patient that prior shara comes from insurance company. Please send request over to insurance for prior auth for patient. Patient has also sent MyChart message regarding refill.

## 2023-12-30 NOTE — Telephone Encounter (Signed)
Please start PA for patient

## 2024-01-06 ENCOUNTER — Telehealth: Payer: Self-pay

## 2024-01-06 ENCOUNTER — Other Ambulatory Visit (HOSPITAL_COMMUNITY): Payer: Self-pay

## 2024-01-06 NOTE — Telephone Encounter (Signed)
 Pharmacy Patient Advocate Encounter   Received notification from Pt Calls Messages that prior authorization for Wegovy  is required/requested.   Insurance verification completed.   The patient is insured through [facility] [employer] .   Per test claim: Refill too soon. PA is not needed at this time. Medication was filled 11/20/2023. Next eligible fill date is 01/22/2024.

## 2024-01-06 NOTE — Telephone Encounter (Signed)
 Per test claim: Refill too soon. PA is not needed at this time. Medication was filled 11/20/2023, per [facility], this was picked up 11/23/2023. Next eligible fill date is 01/22/2024.

## 2024-01-08 NOTE — Telephone Encounter (Signed)
Per test claim and Karin Golden pharmacist, medication does not require PA, medication is too soon to fill. Please see encounter dated 01/06/2024 for further details. Thank you

## 2024-01-20 ENCOUNTER — Other Ambulatory Visit: Payer: Self-pay | Admitting: Dermatology

## 2024-01-20 DIAGNOSIS — L700 Acne vulgaris: Secondary | ICD-10-CM

## 2024-01-21 ENCOUNTER — Telehealth: Payer: Self-pay

## 2024-01-21 ENCOUNTER — Ambulatory Visit: Payer: Managed Care, Other (non HMO) | Admitting: Nurse Practitioner

## 2024-01-21 VITALS — BP 116/62 | HR 78 | Temp 98.0°F | Ht 66.5 in | Wt 141.0 lb

## 2024-01-21 DIAGNOSIS — E663 Overweight: Secondary | ICD-10-CM

## 2024-01-21 DIAGNOSIS — N644 Mastodynia: Secondary | ICD-10-CM | POA: Diagnosis not present

## 2024-01-21 NOTE — Assessment & Plan Note (Signed)
Patient took her last injection of Wegovy earlier this week.  She would like to come off medication as she is at her goal weight.  Patient to continue healthy lifestyle modifications.  Patient is considered normal BMI now.

## 2024-01-21 NOTE — Telephone Encounter (Signed)
Called pt back and informed her that she is not due for a PAP until 02/21/2025.  Pt has no questions or concerns.

## 2024-01-21 NOTE — Assessment & Plan Note (Signed)
Mastalgia is known side effect of spironolactone.  Told her to follow-up with dermatologist who prescribes spironolactone.  If any changes of breast size nipple leakage bleeding or lumps appreciated she is to reach out to the office

## 2024-01-21 NOTE — Patient Instructions (Signed)
Nice to see you today I want to see you in 2 months for you physical and labs Reach out to Dr. Onalee Hua in regards to the breast pain and spironlactone We will discontinue the wegovy at this point

## 2024-01-21 NOTE — Telephone Encounter (Signed)
Copied from CRM [id number]. Topic: Clinical - Medical Advice >> Jan 21, 2024 11:40 AM Leavy Cella D wrote: Reason for CRM: Patient has an annual physical scheduled on April 11 with Dr.Cable. Patient wants to know if the annual will include a pap smear as well .Please notify patient when available

## 2024-01-21 NOTE — Progress Notes (Signed)
Established Patient Office Visit  Subjective   Patient ID: Lauren Solis, female    DOB: April 15, 1986  Age: 38 y.o. MRN: [id number]  Chief Complaint  Patient presents with   Follow-up    Weight Recheck and [facility] recheck. Pt complains of maintaining very well on wegovy. States that she feels good about her weight. States that the back and knee pain. Pt states she does not need wegovy anymore. Last dose was Tuesday.     HPI   Obesity: patinet is currenltly on wegovy 2.4mg . patients max weight was 177 at onset of medication use  She is eating 3 smaller meals a day. In the moring she will have eggs and yougurt dirnk. State that she will do a snack of likfe half a banana and bluberries. She will do a fast food resturant. He will have a snack like a smoothes. States she will do a home cooked meal for dinner  She is still walking with her mom and has exercsie equpment at home. 3-5 days worth of activity.  She does mention that she is having some breast pain but it started after she started on Skelaxin.  From her dermatologist she states she looked up online that it does cause breast pain.  No other symptoms no lumps no discharge or swelling of the breasts.     Review of Systems  Constitutional:  Negative for chills and fever.  Respiratory:  Negative for shortness of breath.   Cardiovascular:  Negative for chest pain.  Genitourinary:        Breast pain       Objective:     BP 116/62   Pulse 78   Temp 98 F (36.7 C) (Oral)   Ht 5' 6.5" (1.689 m)   Wt 141 lb (64 kg)   LMP 01/04/2024 (Exact Date)   SpO2 97%   BMI 22.42 kg/m  BP Readings from Last 3 Encounters:  01/21/24 116/62  12/15/23 104/70  10/21/23 120/78   Wt Readings from Last 3 Encounters:  01/21/24 141 lb (64 kg)  12/15/23 146 lb (66.2 kg)  10/21/23 164 lb 3.2 oz (74.5 kg)   SpO2 Readings from Last 3 Encounters:  01/21/24 97%  12/15/23 95%  10/21/23 99%      Physical Exam Vitals and nursing note  reviewed.  Constitutional:      Appearance: Normal appearance.  Cardiovascular:     Rate and Rhythm: Normal rate and regular rhythm.     Heart sounds: Normal heart sounds.  Pulmonary:     Effort: Pulmonary effort is normal.     Breath sounds: Normal breath sounds.  Abdominal:     General: Bowel sounds are normal.  Neurological:     Mental Status: She is alert.      No results found for any visits on 01/21/24.    The ASCVD Risk score (Arnett DK, et al., 2019) failed to calculate for the following reasons:   The 2019 ASCVD risk score is only valid for ages 62 to 60    Assessment & Plan:   Problem List Items Addressed This Visit       Other   Overweight - Primary   Patient took her last injection of Wegovy earlier this week.  She would like to come off medication as she is at her goal weight.  Patient to continue healthy lifestyle modifications.  Patient is considered normal BMI now.      Breast pain   Mastalgia is known side effect  of spironolactone.  Told her to follow-up with dermatologist who prescribes spironolactone.  If any changes of breast size nipple leakage bleeding or lumps appreciated she is to reach out to the office       Return in about 2 months (around 03/20/2024) for CPE and Labs.    Audria Nine, NP

## 2024-03-01 ENCOUNTER — Ambulatory Visit: Payer: Managed Care, Other (non HMO) | Admitting: Dermatology

## 2024-03-03 ENCOUNTER — Ambulatory Visit: Payer: Managed Care, Other (non HMO) | Admitting: Dermatology

## 2024-03-21 ENCOUNTER — Other Ambulatory Visit: Payer: Self-pay | Admitting: Nurse Practitioner

## 2024-03-21 MED ORDER — WEGOVY 2.4 MG/0.75ML ~~LOC~~ SOAJ: 2.4000 mg | SUBCUTANEOUS | 0 refills | Status: AC

## 2024-03-23 ENCOUNTER — Other Ambulatory Visit (HOSPITAL_COMMUNITY): Payer: Self-pay

## 2024-03-28 ENCOUNTER — Other Ambulatory Visit (HOSPITAL_COMMUNITY): Payer: Self-pay

## 2024-03-28 ENCOUNTER — Telehealth: Payer: Self-pay

## 2024-03-28 NOTE — Telephone Encounter (Signed)
 Pharmacy Patient Advocate Encounter   Received notification from Patient Pharmacy that prior authorization for [facility] 2.4MG /0.75ML auto-injectors is required/requested.   Insurance verification completed.   The patient is insured through [facility] .   Per test claim: PA required; PA started via CoverMyMeds. KEY [id number] . Waiting for clinical questions to populate.

## 2024-03-28 NOTE — Telephone Encounter (Signed)
 Clinical questions have been answered and PA submitted. PA currently Pending. Please be advised that most companies allow up to 30 days to make a decision. We will advise when a determination has been made, or follow up in 1 week.   Please reach out to our team, Rx Prior Auth Pool, if you haven't heard back in a week.

## 2024-03-29 NOTE — Telephone Encounter (Signed)
 Last read by Garald Braver at 10:24AM on 03/29/2024.

## 2024-03-29 NOTE — Telephone Encounter (Signed)
 Per plan, patient must enroll in [email or web address]

## 2024-03-30 ENCOUNTER — Encounter: Payer: Self-pay | Admitting: Dermatology

## 2024-03-30 ENCOUNTER — Ambulatory Visit (INDEPENDENT_AMBULATORY_CARE_PROVIDER_SITE_OTHER): Admitting: Dermatology

## 2024-03-30 VITALS — BP 112/76 | HR 72

## 2024-03-30 DIAGNOSIS — L700 Acne vulgaris: Secondary | ICD-10-CM | POA: Diagnosis not present

## 2024-03-30 NOTE — Progress Notes (Signed)
   Follow-Up Visit   Subjective  Lauren Solis is a 38 y.o. female who presents for the following: Acne  Patient present today for follow up visit for Acne. Patient was last evaluated on 11/02/23. At this visit patient was prescribed Spirolactone 100mg  to take daily & Tretinoin 0.025% 2 times weekly . Patient reports sxs are  improving but had concerns of breast tenderness and would like to know if it is caused by Spironolactone . Patient denies medication changes.  The following portions of the chart were reviewed this encounter and updated as appropriate: medications, allergies, medical history  Review of Systems:  No other skin or systemic complaints except as noted in HPI or Assessment and Plan.  Objective  Well appearing patient in no apparent distress; mood and affect are within normal limits.  A focused examination was performed of the following areas: Face  Relevant exam findings are noted in the Assessment and Plan.                  Assessment & Plan   ACNE VULGARIS Exam: Open comedones and inflammatory papules - Assessment: Patient reports improvement in acne control with spironolactone. Skin appearance noted as "great" by clinician. Patient tolerating tretinoin 0.025% twice weekly, with some dryness during cold weather. Spironolactone side effects include breast tenderness and restlessness around menstrual cycle, but not severe enough to discontinue.  Well controlled  Treatment Plan: - Recommended monitoring the breast tenderness, advised if it happens more frequently to stop the Spironolactone and see if it improves - We will continue Tretinoin 0.025% 2 nights weekly and advised to adjust depending on dryness and weather changes - Recommended applying daily SPF to protect the skin for excessive sun exposure - We will plan to follow up in 6  Months to reassess progress    Return in about 6 months (around 09/29/2024) for Acne F/U.  I, Nell Range, am acting as  Neurosurgeon for [employer], DO.   Documentation: I have reviewed the above documentation for accuracy and completeness, and I agree with the above.  Langston Reusing, DO

## 2024-03-30 NOTE — Patient Instructions (Addendum)
 Hello Lauren Solis,  Thank you for visiting today. Here is a summary of the key instructions:  - Medications:   - Continue spironolactone as prescribed   - Use tretinoin 0.025% two nights a week for the next month, then increase to three nights a week   - If breast tenderness continues and is bothersome, consider stopping spironolactone  - Skin Care:   - Keep using Vichy Mineral 89 (hyaluronic acid) for hydration   - Use a mineral sunscreen daily on face, neck, and chest   - For extended sun exposure, use a stronger sunscreen   - At night:     - Wash face     - Apply Vichy product     - Use a pea-sized amount of tretinoin (on tretinoin nights)     - If skin is dry, apply moisturizer after tretinoin   - Wash face every night, even when not using tretinoin  - Follow-up:   - Return for follow-up appointment in 6 months   - Send a MyChart message if you need prescription refills  We look forward to seeing you at your next visit. If you have any questions or concerns before then, please do not hesitate to contact our office.  Warm regards,  Dr. Langston Reusing, Dermatology         Recommended Sunscreen: Susie Cassette Sunscreen ([employer])      Important Information   Due to recent changes in healthcare laws, you may see results of your pathology and/or laboratory studies on MyChart before the doctors have had a chance to review them. We understand that in some cases there may be results that are confusing or concerning to you. Please understand that not all results are received at the same time and often the doctors may need to interpret multiple results in order to provide you with the best plan of care or course of treatment. Therefore, we ask that you please give [place] 2 business days to thoroughly review all your results before contacting the office for clarification. Should we see a critical lab result, you will be contacted sooner.     If You Need Anything After Your Visit   If  you have any questions or concerns for your doctor, please call our main line at [phone]. If no one answers, please leave a voicemail as directed and we will return your call as soon as possible. Messages left after 4 pm will be answered the following business day.    You may also send [place] a message via MyChart. We typically respond to MyChart messages within 1-2 business days.  For prescription refills, please ask your pharmacy to contact our office. Our fax number is [phone].  If you have an urgent issue when the clinic is closed that cannot wait until the next business day, you can page your doctor at the number below.     Please note that while we do our best to be available for urgent issues outside of office hours, we are not available 24/7.    If you have an urgent issue and are unable to reach [place], you may choose to seek medical care at your doctor's office, retail clinic, urgent care center, or emergency room.   If you have a medical emergency, please immediately call 911 or go to the emergency department. In the event of inclement weather, please call our main line at [phone] for an update on the status of any delays or closures.  Dermatology Medication Tips: Please keep  the boxes that topical medications come in in order to help keep track of the instructions about where and how to use these. Pharmacies typically print the medication instructions only on the boxes and not directly on the medication tubes.   If your medication is too expensive, please contact our office at [phone] or send [place] a message through MyChart.    We are unable to tell what your co-pay for medications will be in advance as this is different depending on your insurance coverage. However, we may be able to find a substitute medication at lower cost or fill out paperwork to get insurance to cover a needed medication.    If a prior authorization is required to get your medication covered by your  insurance company, please allow [place] 1-2 business days to complete this process.   Drug prices often vary depending on where the prescription is filled and some pharmacies may offer cheaper prices.   The website www.goodrx.com contains coupons for medications through different pharmacies. The prices here do not account for what the cost may be with help from insurance (it may be cheaper with your insurance), but the website can give you the price if you did not use any insurance.  - You can print the associated coupon and take it with your prescription to the pharmacy.  - You may also stop by our office during regular business hours and pick up a GoodRx coupon card.  - If you need your prescription sent electronically to a different pharmacy, notify our office through [facility] or by phone at [phone]

## 2024-04-01 ENCOUNTER — Encounter: Payer: Self-pay | Admitting: Nurse Practitioner

## 2024-04-01 ENCOUNTER — Ambulatory Visit: Payer: Managed Care, Other (non HMO) | Admitting: Nurse Practitioner

## 2024-04-01 VITALS — BP 110/62 | HR 71 | Temp 98.6°F | Ht 66.5 in | Wt 148.0 lb

## 2024-04-01 DIAGNOSIS — L709 Acne, unspecified: Secondary | ICD-10-CM

## 2024-04-01 DIAGNOSIS — E7800 Pure hypercholesterolemia, unspecified: Secondary | ICD-10-CM

## 2024-04-01 DIAGNOSIS — D508 Other iron deficiency anemias: Secondary | ICD-10-CM | POA: Diagnosis not present

## 2024-04-01 DIAGNOSIS — Z0000 Encounter for general adult medical examination without abnormal findings: Secondary | ICD-10-CM

## 2024-04-01 LAB — CBC
HCT: 36.9 % (ref 36.0–46.0)
Hemoglobin: 12.5 g/dL (ref 12.0–15.0)
MCHC: 33.8 g/dL (ref 30.0–36.0)
MCV: 78.6 fl (ref 78.0–100.0)
Platelets: 255 10*3/uL (ref 150.0–400.0)
RBC: 4.69 Mil/uL (ref 3.87–5.11)
RDW: 14.7 % (ref 11.5–15.5)
WBC: 3.1 10*3/uL — ABNORMAL LOW (ref 4.0–10.5)

## 2024-04-01 LAB — COMPREHENSIVE METABOLIC PANEL WITH GFR
ALT: 8 U/L (ref 0–35)
AST: 13 U/L (ref 0–37)
Albumin: 4.2 g/dL (ref 3.5–5.2)
Alkaline Phosphatase: 50 U/L (ref 39–117)
BUN: 12 mg/dL (ref 6–23)
CO2: 29 meq/L (ref 19–32)
Calcium: 9.1 mg/dL (ref 8.4–10.5)
Chloride: 104 meq/L (ref 96–112)
Creatinine, Ser: 0.95 mg/dL (ref 0.40–1.20)
GFR: 76.04 mL/min (ref >60.00)
Glucose, Bld: 83 mg/dL (ref 70–99)
Potassium: 4.3 meq/L (ref 3.5–5.1)
Sodium: 138 meq/L (ref 135–145)
Total Bilirubin: 1.1 mg/dL (ref 0.2–1.2)
Total Protein: 6.8 g/dL (ref 6.0–8.3)

## 2024-04-01 LAB — LIPID PANEL
Cholesterol: 142 mg/dL (ref 0–200)
HDL: 55.1 mg/dL (ref >39.00)
LDL Cholesterol: 80 mg/dL (ref 0–99)
NonHDL: 86.83
Total CHOL/HDL Ratio: 3
Triglycerides: 36 mg/dL (ref 0.0–149.0)
VLDL: 7.2 mg/dL (ref 0.0–40.0)

## 2024-04-01 LAB — IBC + FERRITIN
Ferritin: 3.9 ng/mL — ABNORMAL LOW (ref 10.0–291.0)
Iron: 45 ug/dL (ref 42–145)
Saturation Ratios: 10.2 % — ABNORMAL LOW (ref 20.0–50.0)
TIBC: 439.6 ug/dL (ref 250.0–450.0)
Transferrin: 314 mg/dL (ref 212.0–360.0)

## 2024-04-01 NOTE — Assessment & Plan Note (Signed)
 Hx of the same. Pending CBC and iron studies

## 2024-04-01 NOTE — Progress Notes (Signed)
 Established Patient Office Visit  Subjective   Patient ID: Lauren Solis, female    DOB: Aug 02, 1986  Age: 38 y.o. MRN: [id number]  Chief Complaint  Patient presents with   Annual Exam    Fasting    Medical Management of Chronic Issues    Would like to start back up on [facility]     HPI  for complete physical and follow up of chronic conditions.  Acne: she is followed by dermatology and is on spironolactione 100mg     Immunizations: -Tetanus: Completed in 2023 -Influenza: refused  -Shingles: too young  -Pneumonia: too young   Diet: Fair diet. She is doing 2-3 meals a day. She will skip breakfast mostly. States that her schedule has chagned. States that she deos not snack often. State that she is eating out more. State that she was doing the kids meal. States that she ahs been gping back to the  Exercise: Walking 2 days a week and she is doing pilates and dance in the evening   Eye exam: Completes annually  Dental exam: Completes semi-annually    Colonoscopy: too young currently average risk  Lung Cancer Screening: NA   Pap smear: 2021, currently on cycle. Due 2026. She can schedule  Dexa; too young  Mammogram: base line at 39, repeat at age 24. Maternal aunt had Breast CA  Sleep: goes to sleep around 9-930 until 11 is her window. She will get up around 6-615. She is feeling rested some days. States that she will wake up after aout 3 hours.       Review of Systems  Constitutional:  Negative for chills and fever.  Respiratory:  Negative for shortness of breath.   Cardiovascular:  Negative for chest pain and leg swelling.  Gastrointestinal:  Negative for abdominal pain, blood in stool, constipation, diarrhea, nausea and vomiting.       Bm daily   Genitourinary:  Negative for dysuria and hematuria.  Neurological:  Negative for tingling and headaches (ewith her cycle).  Psychiatric/Behavioral:  Negative for hallucinations and suicidal ideas.       Objective:      BP 110/62   Pulse 71   Temp 98.6 F (37 C) (Oral)   Ht 5' 6.5" (1.689 m)   Wt 148 lb (67.1 kg)   LMP 03/29/2024   SpO2 97%   BMI 23.53 kg/m  BP Readings from Last 3 Encounters:  04/01/24 110/62  03/30/24 112/76  01/21/24 116/62   Wt Readings from Last 3 Encounters:  04/01/24 148 lb (67.1 kg)  01/21/24 141 lb (64 kg)  12/15/23 146 lb (66.2 kg)   SpO2 Readings from Last 3 Encounters:  04/01/24 97%  01/21/24 97%  12/15/23 95%      Physical Exam Vitals and nursing note reviewed.  Constitutional:      Appearance: Normal appearance.  HENT:     Right Ear: Tympanic membrane, ear canal and external ear normal.     Left Ear: Tympanic membrane, ear canal and external ear normal.     Mouth/Throat:     Mouth: Mucous membranes are moist.     Pharynx: Oropharynx is clear.  Eyes:     Extraocular Movements: Extraocular movements intact.     Pupils: Pupils are equal, round, and reactive to light.  Cardiovascular:     Rate and Rhythm: Normal rate and regular rhythm.     Pulses: Normal pulses.     Heart sounds: Normal heart sounds.  Pulmonary:  Effort: Pulmonary effort is normal.     Breath sounds: Normal breath sounds.  Abdominal:     General: Bowel sounds are normal. There is no distension.     Palpations: There is no mass.     Tenderness: There is no abdominal tenderness.     Hernia: No hernia is present.  Musculoskeletal:     Right lower leg: No edema.     Left lower leg: No edema.  Lymphadenopathy:     Cervical: No cervical adenopathy.  Skin:    General: Skin is warm.  Neurological:     General: No focal deficit present.     Mental Status: She is alert.     Deep Tendon Reflexes:     Reflex Scores:      Bicep reflexes are 2+ on the right side and 2+ on the left side.      Patellar reflexes are 2+ on the right side and 2+ on the left side.    Comments: Bilateral upper and lower extremity strength 5/5  Psychiatric:        Mood and Affect: Mood normal.         Behavior: Behavior normal.        Thought Content: Thought content normal.        Judgment: Judgment normal.      No results found for any visits on 04/01/24.    The ASCVD Risk score (Arnett DK, et al., 2019) failed to calculate for the following reasons:   The 2019 ASCVD risk score is only valid for ages 72 to 98    Assessment & Plan:   Problem List Items Addressed This Visit       Musculoskeletal and Integument   Acne   Followed by dermatology and on spirolactone 100mg . Continue taking medication as prescribed and follow with dermatology as recommended        Other   Preventative health care - Primary   Discussed age appropriate immunizations and screening exams.  Did review patient's personal, surgical, social, family histories.  Patient is up to date with all age appropriate immunizations. She is too young for CRC screening. She has had baseline mammogram and will be due at 39. She is up to date on cervical cancer screening.  Patient was given information at discharge about anticipatory guidance and preventative healthcare maintenance.      Iron deficiency anemia secondary to inadequate dietary iron intake   Hx of the same. Pending CBC and iron studies       Relevant Orders   CBC   Comprehensive metabolic panel with GFR   IBC + Ferritin   Elevated LDL cholesterol level   History of the same. Patient has lost weight. Pending lipid panel       Relevant Orders   Lipid panel    Return in about 1 year (around 04/01/2025) for CPE and Labs.    Audria Nine, NP

## 2024-04-01 NOTE — Assessment & Plan Note (Signed)
 History of the same. Patient has lost weight. Pending lipid panel

## 2024-04-01 NOTE — Patient Instructions (Signed)
 Nice to see you today I will be in touch with the labs once I have them Follow up with me in 1 year, sooner If you need me   [facility]  Address: [address] [place], [place], [place] [address] Hours:  Closes soon ? 5?PM ? Opens 7?AM Tue Confirmed by this business 7 weeks ago Phone: [phone]

## 2024-04-01 NOTE — Assessment & Plan Note (Signed)
 Discussed age appropriate immunizations and screening exams.  Did review patient's personal, surgical, social, family histories.  Patient is up to date with all age appropriate immunizations. She is too young for CRC screening. She has had baseline mammogram and will be due at 39. She is up to date on cervical cancer screening.  Patient was given information at discharge about anticipatory guidance and preventative healthcare maintenance.

## 2024-04-01 NOTE — Assessment & Plan Note (Signed)
 Followed by dermatology and on spirolactone 100mg . Continue taking medication as prescribed and follow with dermatology as recommended

## 2024-04-04 ENCOUNTER — Encounter: Payer: Self-pay | Admitting: Nurse Practitioner

## 2024-04-23 ENCOUNTER — Other Ambulatory Visit: Payer: Self-pay | Admitting: Dermatology

## 2024-04-23 DIAGNOSIS — L700 Acne vulgaris: Secondary | ICD-10-CM

## 2024-07-31 ENCOUNTER — Other Ambulatory Visit: Payer: Self-pay | Admitting: Dermatology

## 2024-07-31 DIAGNOSIS — L700 Acne vulgaris: Secondary | ICD-10-CM

## 2024-09-29 ENCOUNTER — Encounter: Payer: Self-pay | Admitting: Dermatology

## 2024-09-29 ENCOUNTER — Ambulatory Visit: Admitting: Dermatology

## 2024-09-29 VITALS — BP 127/91 | HR 70

## 2024-09-29 DIAGNOSIS — L700 Acne vulgaris: Secondary | ICD-10-CM | POA: Diagnosis not present

## 2024-09-29 MED ORDER — DOXYCYCLINE HYCLATE 100 MG PO CAPS: 100.0000 mg | ORAL_CAPSULE | Freq: Every day | ORAL | 0 refills | Status: AC

## 2024-09-29 NOTE — Progress Notes (Signed)
   Follow-Up Visit   Subjective  Lauren Solis is a 37 y.o. female who presents for the following: Acne  Patient present today for follow up visit for acne. Patient was last evaluated on 03/30/24. At this visit patient was prescribed .Tretinoin  0.025% 2 nights weekly  Patient reports sxs are better. Patient denies medication changes. Patient states she is happy with the results but she noticed her face getting a little dry so she is using hyaluronic acid watery sun gel and vichy mineral 89 booster pure hyaluronic acid serum.   The following portions of the chart were reviewed this encounter and updated as appropriate: medications, allergies, medical history  Review of Systems:  No other skin or systemic complaints except as noted in HPI or Assessment and Plan.  Objective  Well appearing patient in no apparent distress; mood and affect are within normal limits.    A focused examination was performed of the following areas: Face  Relevant exam findings are noted in the Assessment and Plan.           Assessment & Plan   Acne vulgaris Acne vulgaris is well-controlled with topical treatments and spironolactone , with flares associated with her menstrual cycle.  - Prescribe doxycycline  100 mg, 30 tablets, to be taken one tablet with dinner for 5-7 days as needed during flares. Inform her about potential side effects such as upset stomach and advise taking it with food. - Continue spironolactone  as previously prescribed. - Use benzoyl peroxide for spot treatment in the morning. - Use acne patches for larger lesions as needed. - Continue current skincare regimen with Vichy Mineral 89 and hyaluronic acid. - Introduce Avene Cicalfate for use on tretinoin  nights to enhance tolerance. Provide samples for trial.   ACNE VULGARIS   Related Medications spironolactone  (ALDACTONE ) 100 MG tablet TAKE 1 TABLET BY MOUTH EVERY DAY doxycycline  (VIBRAMYCIN ) 100 MG capsule Take 1 capsule (100 mg  total) by mouth daily. Take one for 5 days as needed with a heavy meal  Return in about 7 months (around 04/29/2025) for Acne follow up .  I, Doyce Pan, CMA, am acting as scribe for [employer], DO.   Documentation: I have reviewed the above documentation for accuracy and completeness, and I agree with the above.  Delon Lenis, DO

## 2024-09-29 NOTE — Patient Instructions (Addendum)
 VISIT SUMMARY:  During your visit, we discussed the management of your acne, which tends to flare up around your menstrual cycle. We reviewed your current skincare regimen and medications, and made some adjustments to help better control your symptoms.  YOUR PLAN:   -ACNE VULGARIS:  Acne vulgaris is a common skin condition that causes pimples and is often influenced by hormonal changes. Your acne is well-controlled with your current treatments, but you experience flares around your menstrual cycle.  We have prescribed doxycycline  100 mg to be taken once daily with dinner for 5-7 days during flares. Be aware of potential side effects like an upset stomach and take it with food. Continue using spironolactone  as prescribed. For spot treatment, use benzoyl peroxide in the morning and acne patches for larger lesions as needed.  Maintain your current skincare regimen with Vichy Mineral 89 and hyaluronic acid.  Additionally, use Avene Cicalfate on tretinoin  nights to help with tolerance. Samples have been provided for you to try.  INSTRUCTIONS:  Please follow up as needed if you experience any issues with your new treatment plan or if your symptoms do not improve.     Important Information  Due to recent changes in healthcare laws, you may see results of your pathology and/or laboratory studies on MyChart before the doctors have had a chance to review them. We understand that in some cases there may be results that are confusing or concerning to you. Please understand that not all results are received at the same time and often the doctors may need to interpret multiple results in order to provide you with the best plan of care or course of treatment. Therefore, we ask that you please give us  2 business days to thoroughly review all your results before contacting the office for clarification. Should we see a critical lab result, you will be contacted sooner.   If You Need Anything After Your  Visit  If you have any questions or concerns for your doctor, please call our main line at [phone] If no one answers, please leave a voicemail as directed and we will return your call as soon as possible. Messages left after 4 pm will be answered the following business day.   You may also send us  a message via MyChart. We typically respond to MyChart messages within 1-2 business days.  For prescription refills, please ask your pharmacy to contact our office. Our fax number is [phone].  If you have an urgent issue when the clinic is closed that cannot wait until the next business day, you can page your doctor at the number below.    Please note that while we do our best to be available for urgent issues outside of office hours, we are not available 24/7.   If you have an urgent issue and are unable to reach us , you may choose to seek medical care at your doctor's office, retail clinic, urgent care center, or emergency room.  If you have a medical emergency, please immediately call 911 or go to the emergency department. In the event of inclement weather, please call our main line at [phone] for an update on the status of any delays or closures.  Dermatology Medication Tips: Please keep the boxes that topical medications come in in order to help keep track of the instructions about where and how to use these. Pharmacies typically print the medication instructions only on the boxes and not directly on the medication tubes.   If your medication is too  expensive, please contact our office at [phone] or send us  a message through MyChart.   We are unable to tell what your co-pay for medications will be in advance as this is different depending on your insurance coverage. However, we may be able to find a substitute medication at lower cost or fill out paperwork to get insurance to cover a needed medication.   If a prior authorization is required to get your medication covered by  your insurance company, please allow us  1-2 business days to complete this process.  Drug prices often vary depending on where the prescription is filled and some pharmacies may offer cheaper prices.  The website www.goodrx.com contains coupons for medications through different pharmacies. The prices here do not account for what the cost may be with help from insurance (it may be cheaper with your insurance), but the website can give you the price if you did not use any insurance.  - You can print the associated coupon and take it with your prescription to the pharmacy.  - You may also stop by our office during regular business hours and pick up a GoodRx coupon card.  - If you need your prescription sent electronically to a different pharmacy, notify our office through [facility] or by phone at [phone]

## 2024-12-17 ENCOUNTER — Other Ambulatory Visit: Payer: Self-pay | Admitting: Dermatology

## 2024-12-17 DIAGNOSIS — L700 Acne vulgaris: Secondary | ICD-10-CM

## 2025-05-01 ENCOUNTER — Ambulatory Visit: Admitting: Dermatology
